# Patient Record
Sex: Female | Born: 1975 | Race: Black or African American | Hispanic: No | Marital: Married | Smoking: Never smoker
Health system: Southern US, Community
[De-identification: ages and names within clinical notes are randomized; demographics above are authoritative.]

## PROBLEM LIST (undated history)

## (undated) DIAGNOSIS — R002 Palpitations: Secondary | ICD-10-CM

## (undated) DIAGNOSIS — R011 Cardiac murmur, unspecified: Secondary | ICD-10-CM

## (undated) DIAGNOSIS — D649 Anemia, unspecified: Secondary | ICD-10-CM

## (undated) HISTORY — DX: Cardiac murmur, unspecified: R01.1

---

## 2008-05-07 ENCOUNTER — Inpatient Hospital Stay: Payer: Self-pay | Admitting: Obstetrics & Gynecology

## 2012-02-15 ENCOUNTER — Observation Stay: Payer: Self-pay

## 2012-02-15 LAB — FETAL FIBRONECTIN
Appearance: NORMAL
Fetal Fibronectin: NEGATIVE

## 2012-04-08 ENCOUNTER — Inpatient Hospital Stay: Payer: Self-pay

## 2012-04-08 LAB — CBC WITH DIFFERENTIAL/PLATELET
Basophil #: 0 10*3/uL (ref 0.0–0.1)
Eosinophil #: 0.1 10*3/uL (ref 0.0–0.7)
HCT: 34 % — ABNORMAL LOW (ref 35.0–47.0)
Lymphocyte #: 1.9 10*3/uL (ref 1.0–3.6)
MCH: 31.9 pg (ref 26.0–34.0)
MCHC: 35.2 g/dL (ref 32.0–36.0)
MCV: 91 fL (ref 80–100)
Monocyte #: 0.7 x10 3/mm (ref 0.2–0.9)
Neutrophil #: 6.5 10*3/uL (ref 1.4–6.5)
Platelet: 137 10*3/uL — ABNORMAL LOW (ref 150–440)
RBC: 3.75 10*6/uL — ABNORMAL LOW (ref 3.80–5.20)
RDW: 16.7 % — ABNORMAL HIGH (ref 11.5–14.5)

## 2012-04-10 LAB — HEMATOCRIT: HCT: 32 % — ABNORMAL LOW (ref 35.0–47.0)

## 2014-09-23 NOTE — H&P (Signed)
L&D Evaluation:  History:   HPI 39 year old G3 P1011 with EDC=04/03/2012 by LMP=06/28/2011 presents at 40 5/7 weeks with c/o strong regular contractions since 1800 tonight. +bloody mucoid show. No LOF. Baby active. Prenatal care at [facility] remarkable for an early ultrasound at [facility] 2 day confirming dates to within 1 week, a normal antomy scan, anemia, and +GBS culture. OB HX remarkable for a ASVD of an 8#4oz baby in 2009. Had her TDAP and flu vaccines this pregnancy. O POS, RI, VI    Presents with contractions    Patient's Medical History anemia    Patient's Surgical History none    Medications Pre Natal Vitamins  Iron    Allergies NKDA    Social History none    Family History Non-Contributory   ROS:   ROS All systems were reviewed.  HEENT, CNS, GI, GU, Respiratory, CV, Renal and Musculoskeletal systems were found to be normal.   Exam:   Vital Signs stable  123/72, afebrile    Urine Protein not completed    General grimacing with ctxs    Mental Status clear    Chest clear    Heart normal sinus rhythm, no murmur/gallop/rubs    Abdomen gravid, tender with contractions    Estimated Fetal Weight Average for gestational age, 72-14    Fetal Position vtx    Edema 1+    Reflexes 1+    Pelvic no external lesions, 3/90%/-1    Mebranes Intact    FHT normal rate with no decels, 130 baseline with accels to 150s to 160s    FHT Description mod variability, CAT 1    Fetal Heart Rate 135    Ucx q4-6 min apart    Skin dry   Impression:   Impression IUP at 40 5/7 weeks in early labor; GBS positive   Plan:   Plan EFM/NST, monitor contractions and for cervical change, antibiotics for GBBS prophylaxis, can ambulate in early labor. Desires epidural when more active.   Electronic Signatures: Karene Fry (CNM)  [phone] 23:47)  Authored: L&D Evaluation   Last Updated: 24-Nov-13 23:47 by Karene Fry (CNM)

## 2014-09-23 NOTE — H&P (Signed)
L&D Evaluation:  History:   HPI 39 year old G3P1011 presents to L&D with c/o ctx's at 33 weeks 1 day. Was seen at office today for c/o ctx's and was noted to be 1 cm. No hx of preterm labor. PNC at [facility] notable for early entry to care and anemia, . EDD 04/03/12    Presents with contractions    Patient's Medical History No Chronic Illness    Patient's Surgical History none    Medications Pre Natal Vitamins  Iron    Allergies NKDA    Social History none    Family History Non-Contributory   ROS:   ROS All systems were reviewed.  HEENT, CNS, GI, GU, Respiratory, CV, Renal and Musculoskeletal systems were found to be normal.   Exam:   Vital Signs stable    Urine Protein not completed    General no apparent distress    Mental Status clear    Abdomen gravid, non-tender    Estimated Fetal Weight Average for gestational age    Edema no edema    Pelvic no external lesions, .5-1/thick/-2    Mebranes Intact    FHT normal rate with no decels    Ucx irregular, mainly irritability   Impression:   Impression 33 weeks, preterm ctx's, no labor   Plan:   Plan EFM/NST, monitor contractions and for cervical change, discharge    Comments cervix is unchanged from previous exam, FFN negative, pt only having occas ctx with some irritability. She works 12 hour night shifts normally, will take out of work for her next 3 shifts in order to allow her to rest. work note given. To follow up in office next week as scheduled.   Electronic Signatures: Shann Medal (CNM)  (Signed 02-Oct-13 14:30)  Authored: L&D Evaluation   Last Updated: 02-Oct-13 14:30 by Shann Medal (CNM)

## 2015-04-01 ENCOUNTER — Ambulatory Visit
Admission: EM | Admit: 2015-04-01 | Discharge: 2015-04-01 | Disposition: A | Discharge status: Home / Self Care | Payer: BC Managed Care – PPO | Attending: Family Medicine | Admitting: Family Medicine

## 2015-04-01 DIAGNOSIS — M25512 Pain in left shoulder: Secondary | ICD-10-CM | POA: Insufficient documentation

## 2015-04-01 DIAGNOSIS — M25561 Pain in right knee: Secondary | ICD-10-CM | POA: Insufficient documentation

## 2015-04-01 DIAGNOSIS — M79609 Pain in unspecified limb: Secondary | ICD-10-CM | POA: Diagnosis not present

## 2015-04-01 DIAGNOSIS — M7542 Impingement syndrome of left shoulder: Secondary | ICD-10-CM | POA: Diagnosis not present

## 2015-04-01 DIAGNOSIS — R002 Palpitations: Secondary | ICD-10-CM | POA: Insufficient documentation

## 2015-04-01 MED ORDER — CELECOXIB 200 MG PO CAPS: 200.0000 mg | ORAL_CAPSULE | Freq: Every day | ORAL | Status: DC

## 2015-04-01 NOTE — ED Provider Notes (Signed)
CSN: [id number]     Arrival date & time 04/01/15  [id number] History   First MD Initiated Contact with Patient 04/01/15 1010     Chief Complaint  Patient presents with  . Knee Pain  . Arm Injury   (Consider location/radiation/quality/duration/timing/severity/associated sxs/prior Treatment) HPI   Sit 39 year old female who presents with 3 separate complaints today.  The first is that of palpitations that will occur spontaneously and intermittently with no associated chest pain but occasional shortness of breath. They last a very short period of time and has no connection to food or activity. An EKG today showed a normal sinus rhythm with no evidence of any ectopy. She states that she does drink a lot of tea and only one cup of coffee usually during her shift at [employer].  Second complaint is that of pain behind her right knee that she notices particularly while driving. Had this for about one week. He denies any injury. She denies any radicular component to the pain and is always very localized to just above the popliteal fossa. Is independent of activity although she states that it is noticed dominantly whenever she is driving. She is not readjusted her seat to see if that will alleviate the pain but she does tend to move her leg around make it less of a bother.  Her third complaint is of numbness that occurred on one occasion lasting for 5 minutes from the elbow into the hand. She states that at nighttime her hand with casing fall asleep and even though she indicates the thumb index and middle fingers she says that all of her TIPS of her fingers seem to go numb. She has pain in her left shoulder. She is right hand dominant. Again she denies any injury to that area.  History reviewed. No pertinent past medical history. History reviewed. No pertinent past surgical history. Family History  Problem Relation Age of Onset  . Hypertension Mother   . Diabetes Father    Social History  Substance Use Topics   . Smoking status: Never Smoker   . Smokeless tobacco: None  . Alcohol Use: Yes     Comment: socially   OB History    No data available     Review of Systems  Constitutional: Positive for activity change. Negative for fever, chills, diaphoresis and fatigue.  Musculoskeletal: Positive for myalgias.  All other systems reviewed and are negative.   Allergies  Review of patient's allergies indicates no known allergies.  Home Medications   Prior to Admission medications   Medication Sig Start Date End Date Taking? Authorizing Provider  calcium carbonate (OS-CAL) 1250 (500 CA) MG chewable tablet Chew 1 tablet by mouth daily.   Yes Historical Provider, MD  glucosamine-chondroitin 500-400 MG tablet Take 1 tablet by mouth 3 (three) times daily.   Yes Historical Provider, MD  celecoxib (CELEBREX) 200 MG capsule Take 1 capsule (200 mg total) by mouth daily. 04/01/15   Lorin Picket, PA-C   Meds Ordered and Administered this Visit  Medications - No data to display  BP 141/92 mmHg  Pulse 70  Temp(Src) 97.4 F (36.3 C) (Tympanic)  Resp 17  Ht 5\' 4"  (1.626 m)  Wt 185 lb (83.915 kg)  BMI 31.74 kg/m2  SpO2 100%  LMP 03/17/2015 (Approximate) No data found.   Physical Exam  Constitutional: She is oriented to person, place, and time. She appears well-developed and well-nourished. No distress.  HENT:  Head: Normocephalic and atraumatic.  Eyes: Pupils are equal,  round, and reactive to light.  Neck: Neck supple.  Cardiovascular: Normal rate, regular rhythm, normal heart sounds and intact distal pulses.  Exam reveals no gallop and no friction rub.   No murmur heard. Pulmonary/Chest: Breath sounds normal. No respiratory distress. She has no wheezes. She has no rales.  Musculoskeletal: Normal range of motion. She exhibits no edema or tenderness.  Examination of the cervical spine shows a normal range of motion flexion extension lateral rotation bilaterally. Upper extremity strength is  intact; sensation is intact to light touch throughout. DTRs are 2+ over 4 and symmetrical. The patient does have some left subacromial tenderness reproducing her symptoms. A negative empty can sign. Arm raise test is negative. Neer test is normal. His admission of her lower extremities specifically on the right shows her to be reluctant to completely straighten the knee for short period of time cause of pain behind her knee which she indicates is more proximal and lateral to the popliteal fossa. Tenderness is intermittent but seems be sharply localized over the same area. She is a negative McMurray sign. Range of motion is full to flexion and extension. There is no collateral ligament laxity. There is a negative anterior drawer sign. Pulses distally are intact in the dorsalis pedis and the popliteal fossa. As well as the posterior tibialis. Station is intact to light touch.  Neurological: She is alert and oriented to person, place, and time.  Skin: Skin is warm and dry. She is not diaphoretic.  Psychiatric: She has a normal mood and affect. Her behavior is normal. Judgment and thought content normal.  Nursing note and vitals reviewed.   ED Course  Procedures (including critical care time)  Labs Review Labs Reviewed - No data to display  Imaging Review No results found.   Visual Acuity Review  Right Eye Distance:   Left Eye Distance:   Bilateral Distance:    Right Eye Near:   Left Eye Near:    Bilateral Near:     ED ECG REPORT   Date: 04/01/2015  EKG Time: 11:12 AM  Rate:64 Rhythm: normal sinus rhythm,  normal EKG, normal sinus rhythm, , there are no previous tracings available for comparison  Axis:normal  Intervals:none  ST&T Change:none  Narrative Interpretation:Normal sinus rhythm.Normal EKG.   I have personally read the EKG and my findings are as noted.            MDM   1. Impingement syndrome of left shoulder   2. Popliteal pain   3. Intermittent palpitations     Discharge Medication List as of 04/01/2015 11:04 AM    START taking these medications   Details  celecoxib (CELEBREX) 200 MG capsule Take 1 capsule (200 mg total) by mouth daily., Starting 04/01/2015, Until Discontinued, Print      Plan: 1. Test/x-ray results and diagnosis reviewed with patient 2. rx as per orders; risks, benefits, potential side effects reviewed with patient 3. Recommend supportive treatment with meds and changes to activities discussed with the patient. 4. F/u prn if symptoms worsen or don't improve  A long discussion with the patient. I've recommended that she consider cutting back or eliminating caffeine in her diet to see if that will help with the palpitations. This does not then I would recommend that she be seen by a cardiologist for possible Holter monitoring. For her left shoulder I stated that she should be careful not to perform activities which seem to cause her to have pain and that she should see  how the anti-inflammatories will help with the discomfort. She may need to be seen by an orthopedist for her knee problem as it was not an injury and I feel no palpable mass in this area. He made the that she would benefit from an MRI but I will leave that to the specialist. I asked her to give the Celebrex a two-week trial to see if this helps both the areas as well as to adjust her seat when driving to see if she can limit the contact to that area behind her knee which may be the cause of her pain. She denies any primary care physician in several years and this may be beneficial to her to follow-up with the primary care for further directions. Her blood pressure is slightly elevated today in addition I recommended she keep an eye on this and then see the primary care physician in this regard.  Lorin Picket, PA-C 04/08/15 [phone]

## 2015-04-01 NOTE — Discharge Instructions (Signed)
Holter Monitoring A Holter monitor is a small device that is used to detect abnormal heart rhythms. It clips to your clothing and is connected by wires to flat, sticky disks (electrodes) that attach to your chest. It is worn continuously for 24-48 hours. HOME CARE INSTRUCTIONS  Wear your Holter monitor at all times, even while exercising and sleeping, for as long as directed by your health care provider.  Make sure that the Holter monitor is safely clipped to your clothing or close to your body as recommended by your health care provider.  Do not get the monitor or wires wet.  Do not put body lotion or moisturizer on your chest.  Keep your skin clean.  Keep a diary of your daily activities, such as walking and doing chores. If you feel that your heartbeat is abnormal or that your heart is fluttering or skipping a beat:  Record what you are doing when it happens.  Record what time of day the symptoms occur.  Return your Holter monitor as directed by your health care provider.  Keep all follow-up visits as directed by your health care provider. This is important. SEEK IMMEDIATE MEDICAL CARE IF:  You feel lightheaded or you faint.  You have trouble breathing.  You feel pain in your chest, upper arm, or jaw.  You feel sick to your stomach and your skin is pale, cool, or damp.  You heartbeat feels unusual or abnormal.   This information is not intended to replace advice given to you by your health care provider. Make sure you discuss any questions you have with your health care provider.   Document Released: 01/29/2004 Document Revised: 05/23/2014 Document Reviewed: 12/09/2013 Elsevier Interactive Patient Education 2016 [employer]  Musculoskeletal Pain Musculoskeletal pain is muscle and boney aches and pains. These pains can occur in any part of the body. Your caregiver may treat you without knowing the cause of the pain. They may treat you if blood or urine tests, X-rays, and  other tests were normal.  CAUSES There is often not a definite cause or reason for these pains. These pains may be caused by a type of germ (virus). The discomfort may also come from overuse. Overuse includes working out too hard when your body is not fit. Boney aches also come from weather changes. Bone is sensitive to atmospheric pressure changes. HOME CARE INSTRUCTIONS   Ask when your test results will be ready. Make sure you get your test results.  Only take over-the-counter or prescription medicines for pain, discomfort, or fever as directed by your caregiver. If you were given medications for your condition, do not drive, operate machinery or power tools, or sign legal documents for 24 hours. Do not drink alcohol. Do not take sleeping pills or other medications that may interfere with treatment.  Continue all activities unless the activities cause more pain. When the pain lessens, slowly resume normal activities. Gradually increase the intensity and duration of the activities or exercise.  During periods of severe pain, bed rest may be helpful. Lay or sit in any position that is comfortable.  Putting ice on the injured area.  Put ice in a bag.  Place a towel between your skin and the bag.  Leave the ice on for 15 to 20 minutes, 3 to 4 times a day.  Follow up with your caregiver for continued problems and no reason can be found for the pain. If the pain becomes worse or does not go away, it may be  necessary to repeat tests or do additional testing. Your caregiver may need to look further for a possible cause. SEEK IMMEDIATE MEDICAL CARE IF:  You have pain that is getting worse and is not relieved by medications.  You develop chest pain that is associated with shortness or breath, sweating, feeling sick to your stomach (nauseous), or throw up (vomit).  Your pain becomes localized to the abdomen.  You develop any new symptoms that seem different or that concern you. MAKE SURE YOU:     Understand these instructions.  Will watch your condition.  Will get help right away if you are not doing well or get worse.   This information is not intended to replace advice given to you by your health care provider. Make sure you discuss any questions you have with your health care provider.   Document Released: 05/02/2005 Document Revised: 07/25/2011 Document Reviewed: 01/04/2013 Elsevier Interactive Patient Education 2016 [place] Cuff Tendinitis Rotator cuff tendinitis is inflammation of the tough, cord-like bands that connect muscle to bone (tendons) in your rotator cuff. Your rotator cuff is the collection of all the muscles and tendons that connect your arm to your shoulder. Your rotator cuff holds the head of your upper arm bone (humerus) in the cup (fossa) of your shoulder blade (scapula). CAUSES Rotator cuff tendinitis is usually caused by overusing the joint involved.  SIGNS AND SYMPTOMS  Deep ache in the shoulder also felt on the outside upper arm over the shoulder muscle.  Point tenderness over the area that is injured.  Pain comes on gradually and becomes worse with lifting the arm to the side (abduction) or turning it inward (internal rotation).  May lead to a chronic tear: When a rotator cuff tendon becomes inflamed, it runs the risk of losing its blood supply, causing some tendon fibers to die. This increases the risk that the tendon can fray and partially or completely tear. DIAGNOSIS Rotator cuff tendinitis is diagnosed by taking a medical history, performing a physical exam, and reviewing results of imaging exams. The medical history is useful to help determine the type of rotator cuff injury. The physical exam will include looking at the injured shoulder, feeling the injured area, and watching you do range-of-motion exercises. X-ray exams are typically done to rule out other causes of shoulder pain, such as fractures. MRI is the imaging exam  usually used for significant shoulder injuries. Sometimes a dye study called CT arthrogram is done, but it is not as widely used as MRI. In some institutions, special ultrasound tests may also be used to aid in the diagnosis. TREATMENT  Less Severe Cases  Use of a sling to rest the shoulder for a short period of time. Prolonged use of the sling can cause stiffness, weakness, and loss of motion of the shoulder joint.  Anti-inflammatory medicines, such as ibuprofen or naproxen sodium, may be prescribed. More Severe Cases  Physical therapy.  Use of steroid injections into the shoulder joint.  Surgery. HOME CARE INSTRUCTIONS   Use a sling or splint until the pain decreases. Prolonged use of the sling can cause stiffness, weakness, and loss of motion of the shoulder joint.  Apply ice to the injured area:  Put ice in a plastic bag.  Place a towel between your skin and the bag.  Leave the ice on for 20 minutes, 2-3 times a day.  Try to avoid use other than gentle range of motion while your shoulder is painful. Use the shoulder  and exercise only as directed by your health care provider. Stop exercises or range of motion if pain or discomfort increases, unless directed otherwise by your health care provider.  Only take over-the-counter or prescription medicines for pain, discomfort, or fever as directed by your health care provider.  If you were given a shoulder sling and straps (immobilizer), do not remove it except as directed, or until you see a health care provider for a follow-up exam. If you need to remove it, move your arm as little as possible or as directed.  You may want to sleep on several pillows at night to lessen swelling and pain. SEEK IMMEDIATE MEDICAL CARE IF:   Your shoulder pain increases or new pain develops in your arm, hand, or fingers and is not relieved with medicines.  You have new, unexplained symptoms, especially increased numbness in the hands or loss of  strength.  You develop any worsening of the problems that brought you in for care.  Your arm, hand, or fingers are numb or tingling.  Your arm, hand, or fingers are swollen, painful, or turn white or blue. MAKE SURE YOU:  Understand these instructions.  Will watch your condition.  Will get help right away if you are not doing well or get worse.   This information is not intended to replace advice given to you by your health care provider. Make sure you discuss any questions you have with your health care provider.   Document Released: 07/23/2003 Document Revised: 05/23/2014 Document Reviewed: 12/12/2012 Elsevier Interactive Patient Education [employer].

## 2015-04-01 NOTE — ED Notes (Signed)
C/o pain behind right knee x 4-5 days. Worsening pain when driving her car. Also numbness between left elbow and wrist lasting 5 minutes, last Friday. Also feels occasional palpitations for several months. Last saw PMD several years ago

## 2015-05-17 DIAGNOSIS — R002 Palpitations: Secondary | ICD-10-CM

## 2015-05-17 HISTORY — DX: Palpitations: R00.2

## 2015-06-18 LAB — HM PAP SMEAR: HM Pap smear: NORMAL

## 2015-06-23 ENCOUNTER — Other Ambulatory Visit: Payer: Self-pay | Admitting: Obstetrics and Gynecology

## 2015-06-23 ENCOUNTER — Other Ambulatory Visit: Payer: Self-pay | Admitting: *Deleted

## 2015-06-23 ENCOUNTER — Inpatient Hospital Stay
Admission: RE | Admit: 2015-06-23 | Discharge: 2015-06-23 | Disposition: A | Discharge status: Home / Self Care | Payer: Self-pay | Source: Ambulatory Visit | Attending: *Deleted | Admitting: *Deleted

## 2015-06-23 DIAGNOSIS — Z9289 Personal history of other medical treatment: Secondary | ICD-10-CM

## 2015-06-23 DIAGNOSIS — R928 Other abnormal and inconclusive findings on diagnostic imaging of breast: Secondary | ICD-10-CM

## 2015-07-07 ENCOUNTER — Other Ambulatory Visit: Payer: Self-pay | Admitting: Obstetrics and Gynecology

## 2015-07-07 ENCOUNTER — Ambulatory Visit
Admission: RE | Admit: 2015-07-07 | Discharge: 2015-07-07 | Disposition: A | Discharge status: Home / Self Care | Payer: BC Managed Care – PPO | Source: Ambulatory Visit | Attending: Obstetrics and Gynecology | Admitting: Obstetrics and Gynecology

## 2015-07-07 DIAGNOSIS — R928 Other abnormal and inconclusive findings on diagnostic imaging of breast: Secondary | ICD-10-CM

## 2015-07-07 DIAGNOSIS — N63 Unspecified lump in breast: Secondary | ICD-10-CM | POA: Insufficient documentation

## 2015-10-26 ENCOUNTER — Encounter: Payer: Self-pay | Admitting: Emergency Medicine

## 2015-10-26 ENCOUNTER — Ambulatory Visit
Admission: EM | Admit: 2015-10-26 | Discharge: 2015-10-26 | Disposition: A | Discharge status: Home / Self Care | Payer: BC Managed Care – PPO | Attending: Family Medicine | Admitting: Family Medicine

## 2015-10-26 DIAGNOSIS — R002 Palpitations: Secondary | ICD-10-CM | POA: Diagnosis not present

## 2015-10-26 NOTE — Discharge Instructions (Signed)
Holter Monitoring  A Holter monitor is a small device that is used to detect abnormal heart rhythms. It clips to your clothing and is connected by wires to flat, sticky disks (electrodes) that attach to your chest. It is worn continuously for 24-48 hours.  HOME CARE INSTRUCTIONS   Wear your Holter monitor at all times, even while exercising and sleeping, for as long as directed by your health care provider.   Make sure that the Holter monitor is safely clipped to your clothing or close to your body as recommended by your health care provider.   Do not get the monitor or wires wet.   Do not put body lotion or moisturizer on your chest.   Keep your skin clean.   Keep a diary of your daily activities, such as walking and doing chores. If you feel that your heartbeat is abnormal or that your heart is fluttering or skipping a beat:    Record what you are doing when it happens.    Record what time of day the symptoms occur.   Return your Holter monitor as directed by your health care provider.   Keep all follow-up visits as directed by your health care provider. This is important.  SEEK IMMEDIATE MEDICAL CARE IF:   You feel lightheaded or you faint.   You have trouble breathing.   You feel pain in your chest, upper arm, or jaw.   You feel sick to your stomach and your skin is pale, cool, or damp.   You heartbeat feels unusual or abnormal.     This information is not intended to replace advice given to you by your health care provider. Make sure you discuss any questions you have with your health care provider.     Document Released: 01/29/2004 Document Revised: 05/23/2014 Document Reviewed: 12/09/2013  Elsevier Interactive Patient Education 2016 Elsevier Inc.  Palpitations  A palpitation is the feeling that your heartbeat is irregular or is faster than normal. It may feel like your heart is fluttering or skipping a beat. Palpitations are usually not a serious problem. However, in some cases, you may need  further medical evaluation.  CAUSES   Palpitations can be caused by:   Smoking.   Caffeine or other stimulants, such as diet pills or energy drinks.   Alcohol.   Stress and anxiety.   Strenuous physical activity.   Fatigue.   Certain medicines.   Heart disease, especially if you have a history of irregular heart rhythms (arrhythmias), such as atrial fibrillation, atrial flutter, or supraventricular tachycardia.   An improperly working pacemaker or defibrillator.  DIAGNOSIS   To find the cause of your palpitations, your health care provider will take your medical history and perform a physical exam. Your health care provider may also have you take a test called an ambulatory electrocardiogram (ECG). An ECG records your heartbeat patterns over a 24-hour period. You may also have other tests, such as:   Transthoracic echocardiogram (TTE). During echocardiography, sound waves are used to evaluate how blood flows through your heart.   Transesophageal echocardiogram (TEE).   Cardiac monitoring. This allows your health care provider to monitor your heart rate and rhythm in real time.   Holter monitor. This is a portable device that records your heartbeat and can help diagnose heart arrhythmias. It allows your health care provider to track your heart activity for several days, if needed.   Stress tests by exercise or by giving medicine that makes the heart beat faster.    TREATMENT   Treatment of palpitations depends on the cause of your symptoms and can vary greatly. Most cases of palpitations do not require any treatment other than time, relaxation, and monitoring your symptoms. Other causes, such as atrial fibrillation, atrial flutter, or supraventricular tachycardia, usually require further treatment.  HOME CARE INSTRUCTIONS    Avoid:    Caffeinated coffee, tea, soft drinks, diet pills, and energy drinks.    Chocolate.    Alcohol.   Stop smoking if you smoke.   Reduce your stress and anxiety. Things that  can help you relax include:    A method of controlling things in your body, such as your heartbeats, with your mind (biofeedback).    Yoga.    Meditation.    Physical activity such as swimming, jogging, or walking.   Get plenty of rest and sleep.  SEEK MEDICAL CARE IF:    You continue to have a fast or irregular heartbeat beyond 24 hours.   Your palpitations occur more often.  SEEK IMMEDIATE MEDICAL CARE IF:   You have chest pain or shortness of breath.   You have a severe headache.   You feel dizzy or you faint.  MAKE SURE YOU:   Understand these instructions.   Will watch your condition.   Will get help right away if you are not doing well or get worse.     This information is not intended to replace advice given to you by your health care provider. Make sure you discuss any questions you have with your health care provider.     Document Released: 04/29/2000 Document Revised: 05/07/2013 Document Reviewed: 07/01/2011  Elsevier Interactive Patient Education 2016 Elsevier Inc.

## 2015-10-26 NOTE — ED Provider Notes (Signed)
CSN: [id number]     Arrival date & time 10/26/15  [id number] History   First MD Initiated Contact with Patient 10/26/15 1010     Chief Complaint  Patient presents with  . 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 She was lying in bed reading the newspaper reports on her phone when she started having an arrhythmia with a feeling of something burning in her chest and then having a very rapid rhythm. Started having shortness of breath and some chest pain and back pain which has since subsided. She was seen by her primary care physician with similar complaints and she has been scheduled for Holter monitor through [facility] heart on Wednesday next week.     History reviewed. No pertinent past medical history. History reviewed. No pertinent past surgical history. Family History  Problem Relation Age of Onset  . Hypertension Mother   . Diabetes Father   . Breast cancer Neg Hx    Social History  Substance Use Topics  . Smoking status: Never Smoker   . Smokeless tobacco: None  . Alcohol Use: Yes     Comment: socially   OB History    No data available     Review of Systems  Constitutional: Negative for fever, chills, activity change and fatigue.  Respiratory: Positive for shortness of breath.   Cardiovascular: Positive for chest pain and palpitations. Negative for leg swelling.  All other systems reviewed and are negative.   Allergies  Review of patient's allergies indicates no known allergies.  Home Medications   Prior to Admission medications   Medication Sig Start Date End Date Taking? Authorizing Provider  calcium carbonate (OS-CAL) 1250 (500 CA) MG chewable tablet Chew 1 tablet by mouth daily.    Historical Provider, MD  celecoxib (CELEBREX) 200 MG capsule Take 1 capsule  (200 mg total) by mouth daily. 04/01/15   Lorin Picket, PA-C  glucosamine-chondroitin 500-400 MG tablet Take 1 tablet by mouth 3 (three) times daily.    Historical Provider, MD   Meds Ordered and Administered this Visit  Medications - No data to display  BP 140/65 mmHg  Pulse 71  Temp(Src) 98.2 F (36.8 C) (Oral)  Resp 16  Ht 5\' 4"  (1.626 m)  Wt 195 lb (88.451 kg)  BMI 33.46 kg/m2  SpO2 100%  LMP 10/05/2015 (Approximate) No data found.   Physical Exam  Constitutional: She is oriented to person, place, and time. She appears well-developed and well-nourished. No distress.  HENT:  Head: Normocephalic and atraumatic.  Eyes: Conjunctivae are normal. Pupils are equal, round, and reactive to light.  Neck: Normal range of motion. Neck supple.  Cardiovascular: Normal rate and normal heart sounds.  Exam reveals no gallop and no friction rub.   No murmur heard. Examination of the heart shows a normal rate with an audible arrhythmia. There is a normal S1-S2 there is no murmur or gallop appreciated.  Pulmonary/Chest: Effort normal and breath sounds normal. No respiratory distress. She has no wheezes. She has no rales.  Musculoskeletal: Normal range of motion.  Neurological: She is alert and oriented to person, place, and time.  Skin: Skin is warm and dry. She is not diaphoretic.  Psychiatric: She has a normal mood and affect. Her behavior is normal. Judgment and thought content normal.  Nursing note and vitals reviewed.   ED Course  Procedures (including  critical care time)  Labs Review Labs Reviewed - No data to display  Imaging Review No results found.   Visual Acuity Review  Right Eye Distance:   Left Eye Distance:   Bilateral Distance:    Right Eye Near:   Left Eye Near:    Bilateral Near:      EKG performed today shows a sinus rhythm with a marked sinus arrhythmia. Ventricular rate of 67 bpm., PR interval 162 ms, QRS duration 84 ms,QTC of 412 ms, axis is normal.  This tracing compared with a tracing from 04/01/2015 shows the sinus rhythm with a marked sinus arrhythmia and inverted QRS complexes in V3 today demonstrating poor R wave progression. Otherwise no significant change seen.     MDM   1. Intermittent palpitations    Discharge Medication List as of 10/26/2015 10:40 AM    Plan: 1. Test/x-ray results and diagnosis reviewed with patient 2. rx as per orders; risks, benefits, potential side effects reviewed with patient 3. Recommend supportive treatment with Abstinence from caffeine until reevaluated by the cardiologist. She does drink 4 glasses of wine per week may also be a trigger for the arrhythmia. If she has increasing rapid rhythms that she is not able to break with Valsalva maneuver where she has chest pain that last bone and 5 minutes she should be seen in the emergency department by calling 911. I've also recommended that iinstead of just having a Holter monitor applied she should be evaluated by the cardiologist as well. Medications were not provided to the patient at this visit. 4. F/u prn if symptoms worsen or don't improve      Lorin Picket, PA-C 10/26/15 1117

## 2015-10-26 NOTE — ED Notes (Signed)
Patient c/o palpitations and increase heart rate that started early this morning.  Patient reports SOB.   Patient saw PCP a month ago and has scheduled her to wear a holter monitor.

## 2016-05-16 HISTORY — PX: BREAST EXCISIONAL BIOPSY: SUR124

## 2016-06-24 ENCOUNTER — Other Ambulatory Visit: Payer: Self-pay | Admitting: Obstetrics and Gynecology

## 2016-06-24 DIAGNOSIS — N6313 Unspecified lump in the right breast, lower outer quadrant: Secondary | ICD-10-CM

## 2016-06-24 DIAGNOSIS — N6310 Unspecified lump in the right breast, unspecified quadrant: Secondary | ICD-10-CM

## 2016-06-24 DIAGNOSIS — N6311 Unspecified lump in the right breast, upper outer quadrant: Secondary | ICD-10-CM

## 2016-07-15 ENCOUNTER — Ambulatory Visit
Admission: RE | Admit: 2016-07-15 | Discharge: 2016-07-15 | Disposition: A | Discharge status: Home / Self Care | Payer: BC Managed Care – PPO | Source: Ambulatory Visit | Attending: Obstetrics and Gynecology | Admitting: Obstetrics and Gynecology

## 2016-07-15 ENCOUNTER — Other Ambulatory Visit: Payer: Self-pay | Admitting: Obstetrics and Gynecology

## 2016-07-15 DIAGNOSIS — N6311 Unspecified lump in the right breast, upper outer quadrant: Secondary | ICD-10-CM | POA: Diagnosis not present

## 2016-07-15 DIAGNOSIS — N6313 Unspecified lump in the right breast, lower outer quadrant: Secondary | ICD-10-CM

## 2016-07-15 DIAGNOSIS — N6310 Unspecified lump in the right breast, unspecified quadrant: Secondary | ICD-10-CM

## 2016-07-15 DIAGNOSIS — R928 Other abnormal and inconclusive findings on diagnostic imaging of breast: Secondary | ICD-10-CM

## 2016-08-04 ENCOUNTER — Ambulatory Visit
Admission: RE | Admit: 2016-08-04 | Discharge: 2016-08-04 | Disposition: A | Discharge status: Home / Self Care | Payer: BC Managed Care – PPO | Source: Ambulatory Visit | Attending: Obstetrics and Gynecology | Admitting: Obstetrics and Gynecology

## 2016-08-04 DIAGNOSIS — N6310 Unspecified lump in the right breast, unspecified quadrant: Secondary | ICD-10-CM

## 2016-08-04 DIAGNOSIS — R928 Other abnormal and inconclusive findings on diagnostic imaging of breast: Secondary | ICD-10-CM | POA: Diagnosis present

## 2016-08-04 DIAGNOSIS — N6314 Unspecified lump in the right breast, lower inner quadrant: Secondary | ICD-10-CM | POA: Insufficient documentation

## 2016-08-04 HISTORY — PX: BREAST BIOPSY: SHX20

## 2016-08-05 ENCOUNTER — Telehealth: Payer: Self-pay

## 2016-08-05 LAB — SURGICAL PATHOLOGY

## 2016-08-05 NOTE — Telephone Encounter (Signed)
Arville Go states that they received a form saying that he wants to call pt regarding results. Arville Go is faxing over results and I will give them to SDJ to review.

## 2016-08-05 NOTE — Telephone Encounter (Signed)
Joann from Dr. Brigitte Pulse, radiologist c [facility] called re bx results and rec from yesterday's procedure. Please call.

## 2016-08-10 ENCOUNTER — Telehealth: Payer: Self-pay | Admitting: Obstetrics and Gynecology

## 2016-08-10 DIAGNOSIS — N6315 Unspecified lump in the right breast, overlapping quadrants: Secondary | ICD-10-CM

## 2016-08-10 DIAGNOSIS — N6310 Unspecified lump in the right breast, unspecified quadrant: Principal | ICD-10-CM

## 2016-08-10 NOTE — Telephone Encounter (Signed)
Called patient to discuss having breast mass removed.  No answer. Left VM. Please let me know when she call back.

## 2016-08-10 NOTE — Telephone Encounter (Signed)
Pt returned your call, lm on my ext to let you know you can call her back

## 2016-08-10 NOTE — Telephone Encounter (Signed)
Left vm to call me again.

## 2016-08-11 NOTE — Telephone Encounter (Signed)
Pt returning call from yesterday.  Will keep phone beside her.  [phone]

## 2016-08-16 NOTE — Telephone Encounter (Signed)
Left vm to call me

## 2016-08-17 NOTE — Telephone Encounter (Signed)
Please call pt . Pt Would like an return call

## 2016-08-17 NOTE — Telephone Encounter (Signed)
Left vm to call me

## 2016-08-18 ENCOUNTER — Encounter: Payer: Self-pay | Admitting: General Surgery

## 2016-08-18 DIAGNOSIS — N6310 Unspecified lump in the right breast, unspecified quadrant: Secondary | ICD-10-CM | POA: Insufficient documentation

## 2016-08-18 NOTE — Addendum Note (Signed)
Addended by: Prentice Docker D on: 08/18/2016 01:29 PM   Modules accepted: Orders

## 2016-08-18 NOTE — Telephone Encounter (Signed)
Patient is scheduled for Dr Dwyane Luo first available appointment on Tues ,08/30/16 @ 2:00pm. Threasa Beards will mail paperwork. Patient to arrive @ 1:45pm w/ completed paperwork, 1:30pm if not, and bring ins/id/specialty copay/meds in original bottles. Lmtrc.

## 2016-08-18 NOTE — Telephone Encounter (Addendum)
Discussed path findings with patient.  Plan is refer to general surgery for complete mass removal and path review to be sure mass is benign.  She understands that I will refer her to Dr. Bary Castilla.

## 2016-08-22 NOTE — Telephone Encounter (Signed)
Pt aware.

## 2016-08-24 ENCOUNTER — Encounter: Payer: Self-pay | Admitting: *Deleted

## 2016-08-30 ENCOUNTER — Encounter: Payer: Self-pay | Admitting: General Surgery

## 2016-08-30 ENCOUNTER — Inpatient Hospital Stay: Payer: Self-pay

## 2016-08-30 ENCOUNTER — Ambulatory Visit (INDEPENDENT_AMBULATORY_CARE_PROVIDER_SITE_OTHER): Payer: BC Managed Care – PPO | Admitting: General Surgery

## 2016-08-30 VITALS — BP 106/62 | HR 62 | Resp 12 | Ht 64.0 in | Wt 200.0 lb

## 2016-08-30 DIAGNOSIS — N6310 Unspecified lump in the right breast, unspecified quadrant: Secondary | ICD-10-CM | POA: Diagnosis not present

## 2016-08-30 NOTE — Progress Notes (Signed)
Patient ID: Karina Martin, female   DOB: [birth year], 41 y.o.   MRN: [id number]  Chief Complaint  Patient presents with  . Mass    HPI Karina Martin is a 41 y.o. female.  who presents for a breast evaluation referred by Dr Prentice Docker. The most recent mammogram with breast biopsy was done on 08-04-16.  Patient does perform regular self breast checks and gets regular mammograms done.  She could not feel anything different in the breast. No breast injury or trauma.  Family history of breast cancer is distance cousins. 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  HPI  Past Medical History:  Diagnosis Date  . Murmur     Past Surgical History:  Procedure Laterality Date  . 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 Hypertension Mother   . Diabetes Father   . Breast cancer Cousin     second cousin on fathers side  . Colon cancer Neg Hx     Social History Social History  Substance Use Topics  . Smoking status: Never Smoker  . Smokeless tobacco: Never Used  . Alcohol use Yes     Comment: wine socially    No Known Allergies  No current outpatient prescriptions on file.   No current facility-administered medications for this visit.     Review of Systems Review of Systems  Constitutional: Negative.   Respiratory: Negative.   Cardiovascular: Negative.   Neurological: Positive for headaches.    Blood pressure 106/62, pulse 62, resp. rate 12, height 5\' 4"  (1.626 m), weight 200 lb (90.7 kg), last menstrual period 08/21/2016.  Physical Exam Physical Exam  Constitutional: She is oriented to person, place, and time. She appears well-developed and well-nourished.  HENT:  Mouth/Throat: Oropharynx is clear and moist.  Eyes: Conjunctivae are normal. No scleral icterus.  Neck: Neck supple.  Cardiovascular: Normal rate and regular rhythm.   Murmur heard.  Systolic murmur is present with a grade of 2/6  Pulmonary/Chest: Effort normal and breath sounds normal. Right breast exhibits no inverted nipple, no mass, no nipple discharge, no skin change and no tenderness. Left breast exhibits no inverted nipple, no mass, no nipple discharge, no skin change and no tenderness.    Lymphadenopathy:    She has no cervical adenopathy.    She has no axillary adenopathy.  Neurological: She is alert and oriented to person, place, and time.  Skin: Skin is warm and dry.  Psychiatric: Her behavior is normal.    Data Reviewed March 22nd, 2018 core biopsy of the right breast, 6:00 lesion: DIAGNOSIS:  A. 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 IMPRESSION: 1. This circumscribed oval solid mass in the 6 o'clock region of the right breast has increased in size since February 2017. Although imaging features remain compatible with fibroadenoma, malignancy cannot be excluded and ultrasound-guided biopsy is suggested. 2. 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 3. No evidence of malignancy in the left breast.   Ultrasound examination of the breast was completed to determine if imaging localization would be required postbiopsy.  In the right breast at the 9:00 position, 7 cm from the nipple a ellipsoid mass measuring 0.4 x 0.9 x 1.02 cm is identified. This is essentially anechoic with very little  if any posterior acoustic enhancement. Minimal distortion of adjacent breast architecture. BI-RADS-3.  The lesion at the 6:00 position, 3 cm from nipple is deeply based about 3 cm in depth. This is is softly lobulated and irregular measuring 1.2 x 1.3 x 1.8 cm. The biopsy clip is evident within the lesion. BI-RADS-3.  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   Plan    At the time of her initial imaging in February 2017 the patient was encouraged to have a 6 month follow-up to confirm stability of the lesions. Based on record review this was deferred to 12 month follow-up.  The core samples are likely accurate, and this is barely a cellular fibroadenoma rather than a phyllodes tumor.  The operative to have the second, stable lesion removed to the time of her planned surgical procedure was reviewed. Indications for this would remove need for special follow-up imaging of this lesion. Risks of additional surgery are minimal, as she will already the under general anesthesia for removal of the deeper lesion at the 6:00 position.  The possibility separate incision was reviewed.  This is a desires for the patient and we'll plan to proceed with excision of both lesions in the near future.     Recommend excision of right breast mass with Same Day Surgery  HPI, Physical Exam, Assessment and Plan have been scribed under the direction and in the presence of Karina Bellow, MD.  Karina Fetch, RN  The patient is scheduled for surgery at [facility] on 09/06/16. She will pre admit by phone. The patient is aware of date and instructions.  Documented by Lesly Rubenstein LPN  I have completed the exam and reviewed the above documentation for accuracy and completeness.  I agree with the above.  Haematologist has been used and any errors in dictation or transcription are unintentional.  Hervey Ard, M.D., F.A.C.S.  Karina Martin 08/31/2016, 6:48 AM

## 2016-08-30 NOTE — Patient Instructions (Addendum)
The patient is aware to call back for any questions or concerns. excision of right breast mass with Same Day Surgery  The patient is scheduled for surgery at [facility] on 09/06/16. She will pre admit by phone. The patient is aware of date and instructions.

## 2016-09-01 ENCOUNTER — Encounter
Admission: RE | Admit: 2016-09-01 | Discharge: 2016-09-01 | Disposition: A | Discharge status: Home / Self Care | Payer: BC Managed Care – PPO | Source: Ambulatory Visit | Attending: General Surgery | Admitting: General Surgery

## 2016-09-01 HISTORY — DX: Palpitations: R00.2

## 2016-09-01 HISTORY — DX: Anemia, unspecified: D64.9

## 2016-09-01 NOTE — Pre-Procedure Instructions (Signed)
CARD HOLTER MONITOR 48 HOURS - SCAN AND ANALYSIS8/01/2016 [facility] Result Narrative  Recording Start Date/Time: 12/16/2015 2:22:04 PM Min HR: 45 BPM At: 12/17/2015 2:06:26 PM Max HR: 152 BPM At: 12/18/2015 1:28:29 PM Avg HR: 75 BPM Pauses > 2032ms:0 Longest RR: 1.556 at 12/17/2015 2:40:06 PM  Ventricular Beats: 122 Couplets: 0 Runs: 0 Fastest Run:BPM at  Longest Ventricular Run:BPM at  Supraventricular Prematurity: 25 % Supraventricular Beats: 2 Supraventricular Fastest Run:BPM at   Referring Physician: Durward Parcel ([id number]) Processing Location: [facility] Procedure Type: HOLTER SCAN & ANALYSIS Date Processed: 12/23/2015 Recording Duration: 47 hr, 56 min Hookup Technician: Cherre Huger Analyst: Sherene Sires Recorder Number: [id number]  Diagnosis: Palpitations [R00.2]  Conclusions: The predominant rhythm was sinus with an average heart rate of 75 bpm.Atrial and ventricular ectopy were rare.There were no pauses of greater than 2 seconds and there were no episodes of AV block.On several occasions, the patient  complained of palpitation, and on each occasion the rhythm was sinus arrhythmia with no ectopy.Monitor quality was fair. Reviewing Physician:  Signed By: Renne Musca, M.D. Date: 12/28/2015  Status Results Details    Appointment on 12/16/2015 [place]")' href="[email or web address]/">Encounter Summary

## 2016-09-01 NOTE — Patient Instructions (Signed)
  Your procedure is scheduled on: 09-06-16 Report to Same Day Surgery 2nd floor medical mall [facility] Entrance-take elevator on left to 2nd floor.  Check in with surgery information desk.) To find out your arrival time please call [phone] between 1PM - 3PM on 09-05-16  Remember: Instructions that are not followed completely may result in serious medical risk, up to and including death, or upon the discretion of your surgeon and anesthesiologist your surgery may need to be rescheduled.    _x___ 1. Do not eat food or drink liquids after midnight. No gum chewing or                              hard candies.     __x__ 2. No Alcohol for 24 hours before or after surgery.   __x__3. No Smoking for 24 prior to surgery.   ____  4. Bring all medications with you on the day of surgery if instructed.    __x__ 5. Notify your doctor if there is any change in your medical condition     (cold, fever, infections).     Do not wear jewelry, make-up, hairpins, clips or nail polish.  Do not wear lotions, powders, or perfumes. You may wear deodorant.  Do not shave 48 hours prior to surgery. Men may shave face and neck.  Do not bring valuables to the hospital.    [facility] is not responsible for any belongings or valuables.               Contacts, dentures or bridgework may not be worn into surgery.  Leave your suitcase in the car. After surgery it may be brought to your room.  For patients admitted to the hospital, discharge time is determined by your                       treatment team.   Patients discharged the day of surgery will not be allowed to drive home.  You will need someone to drive you home and stay with you the night of your procedure.    Please read over the following fact sheets that you were given:   [facility] Preparing for Surgery and or MRSA Information   ____ Take anti-hypertensive (unless it includes a diuretic), cardiac, seizure, asthma,     anti-reflux and psychiatric  medicines. These include:  1. NONE  2.  3.  4.  5.  6.  ____Fleets enema or Magnesium Citrate as directed.   ____ Use CHG Soap or sage wipes as directed on instruction sheet   ____ Use inhalers on the day of surgery and bring to hospital day of surgery  ____ Stop Metformin and Janumet 2 days prior to surgery.    ____ Take 1/2 of usual insulin dose the night before surgery and none on the morning     surgery.   ____ Follow recommendations from Cardiologist, Pulmonologist or PCP regarding  stopping Aspirin, Coumadin, Pllavix ,Eliquis, Effient, or Pradaxa, and Pletal.  ____Stop Anti-inflammatories such as Advil, Aleve, Ibuprofen, Motrin, Naproxen, Naprosyn, Goodies powders or aspirin products -OK to take Tylenol    ____ Stop supplements until after surgery.     ____ Bring C-Pap to the hospital.

## 2016-09-06 ENCOUNTER — Ambulatory Visit: Payer: BC Managed Care – PPO | Admitting: Certified Registered Nurse Anesthetist

## 2016-09-06 ENCOUNTER — Encounter
Admission: RE | Disposition: A | Discharge status: Home / Self Care | Payer: Self-pay | Source: Ambulatory Visit | Attending: General Surgery

## 2016-09-06 ENCOUNTER — Encounter: Payer: Self-pay | Admitting: *Deleted

## 2016-09-06 ENCOUNTER — Ambulatory Visit
Admission: RE | Admit: 2016-09-06 | Discharge: 2016-09-06 | Disposition: A | Discharge status: Home / Self Care | Payer: BC Managed Care – PPO | Source: Ambulatory Visit | Attending: General Surgery | Admitting: General Surgery

## 2016-09-06 DIAGNOSIS — N6310 Unspecified lump in the right breast, unspecified quadrant: Secondary | ICD-10-CM | POA: Diagnosis present

## 2016-09-06 DIAGNOSIS — D241 Benign neoplasm of right breast: Secondary | ICD-10-CM | POA: Insufficient documentation

## 2016-09-06 DIAGNOSIS — N6313 Unspecified lump in the right breast, lower outer quadrant: Secondary | ICD-10-CM | POA: Diagnosis not present

## 2016-09-06 HISTORY — PX: BREAST LUMPECTOMY: SHX2

## 2016-09-06 LAB — POCT PREGNANCY, URINE: Preg Test, Ur: NEGATIVE

## 2016-09-06 SURGERY — BREAST LUMPECTOMY
Anesthesia: General | Laterality: Right | Wound class: Clean

## 2016-09-06 MED ORDER — MIDAZOLAM HCL 2 MG/2ML IJ SOLN
INTRAMUSCULAR | Status: DC | PRN
  Administered 2016-09-06: 2 mg via INTRAVENOUS

## 2016-09-06 MED ORDER — ACETAMINOPHEN 10 MG/ML IV SOLN
INTRAVENOUS | Status: AC
  Filled 2016-09-06: qty 100

## 2016-09-06 MED ORDER — KETOROLAC TROMETHAMINE 30 MG/ML IJ SOLN
INTRAMUSCULAR | Status: DC | PRN
  Administered 2016-09-06: 30 mg via INTRAVENOUS

## 2016-09-06 MED ORDER — LIDOCAINE HCL (PF) 2 % IJ SOLN
INTRAMUSCULAR | Status: AC
  Filled 2016-09-06: qty 2

## 2016-09-06 MED ORDER — FAMOTIDINE 20 MG PO TABS
20.0000 mg | ORAL_TABLET | Freq: Once | ORAL | Status: AC
  Administered 2016-09-06: 20 mg via ORAL

## 2016-09-06 MED ORDER — PROPOFOL 10 MG/ML IV BOLUS
INTRAVENOUS | Status: AC
  Filled 2016-09-06: qty 20

## 2016-09-06 MED ORDER — KETOROLAC TROMETHAMINE 30 MG/ML IJ SOLN
INTRAMUSCULAR | Status: AC
  Filled 2016-09-06: qty 1

## 2016-09-06 MED ORDER — ONDANSETRON HCL 4 MG/2ML IJ SOLN
INTRAMUSCULAR | Status: AC
  Filled 2016-09-06: qty 2

## 2016-09-06 MED ORDER — FAMOTIDINE 20 MG PO TABS
ORAL_TABLET | ORAL | Status: AC
  Administered 2016-09-06: 20 mg via ORAL
  Filled 2016-09-06: qty 1

## 2016-09-06 MED ORDER — PHENYLEPHRINE HCL 10 MG/ML IJ SOLN
INTRAMUSCULAR | Status: DC | PRN
  Administered 2016-09-06: 100 ug via INTRAVENOUS

## 2016-09-06 MED ORDER — BUPIVACAINE-EPINEPHRINE (PF) 0.5% -1:200000 IJ SOLN
INTRAMUSCULAR | Status: AC
  Filled 2016-09-06: qty 30

## 2016-09-06 MED ORDER — HYDROCODONE-ACETAMINOPHEN 5-325 MG PO TABS: 1.0000 | ORAL_TABLET | ORAL | 0 refills | Status: DC | PRN

## 2016-09-06 MED ORDER — LACTATED RINGERS IV SOLN
INTRAVENOUS | Status: DC
  Administered 2016-09-06: 11:00:00 via INTRAVENOUS

## 2016-09-06 MED ORDER — BUPIVACAINE-EPINEPHRINE (PF) 0.5% -1:200000 IJ SOLN
INTRAMUSCULAR | Status: DC | PRN
  Administered 2016-09-06: 13 mL

## 2016-09-06 MED ORDER — LIDOCAINE HCL (CARDIAC) 20 MG/ML IV SOLN
INTRAVENOUS | Status: DC | PRN
  Administered 2016-09-06: 80 mg via INTRAVENOUS

## 2016-09-06 MED ORDER — ACETAMINOPHEN 10 MG/ML IV SOLN
INTRAVENOUS | Status: DC | PRN
  Administered 2016-09-06: 1000 mg via INTRAVENOUS

## 2016-09-06 MED ORDER — MIDAZOLAM HCL 2 MG/2ML IJ SOLN
INTRAMUSCULAR | Status: AC
  Filled 2016-09-06: qty 2

## 2016-09-06 MED ORDER — ONDANSETRON HCL 4 MG/2ML IJ SOLN: 4.0000 mg | Freq: Once | INTRAMUSCULAR | Status: DC | PRN

## 2016-09-06 MED ORDER — DEXAMETHASONE SODIUM PHOSPHATE 10 MG/ML IJ SOLN
INTRAMUSCULAR | Status: DC | PRN
  Administered 2016-09-06: 10 mg via INTRAVENOUS

## 2016-09-06 MED ORDER — DEXAMETHASONE SODIUM PHOSPHATE 10 MG/ML IJ SOLN
INTRAMUSCULAR | Status: AC
  Filled 2016-09-06: qty 1

## 2016-09-06 MED ORDER — FENTANYL CITRATE (PF) 100 MCG/2ML IJ SOLN
INTRAMUSCULAR | Status: AC
  Filled 2016-09-06: qty 2

## 2016-09-06 MED ORDER — FENTANYL CITRATE (PF) 100 MCG/2ML IJ SOLN: 25.0000 ug | INTRAMUSCULAR | Status: DC | PRN

## 2016-09-06 MED ORDER — FENTANYL CITRATE (PF) 100 MCG/2ML IJ SOLN
INTRAMUSCULAR | Status: DC | PRN
  Administered 2016-09-06 (×2): 50 ug via INTRAVENOUS

## 2016-09-06 MED ORDER — PROPOFOL 10 MG/ML IV BOLUS
INTRAVENOUS | Status: DC | PRN
  Administered 2016-09-06: 200 mg via INTRAVENOUS

## 2016-09-06 SURGICAL SUPPLY — 50 items
BANDAGE ELASTIC 6 LF NS (GAUZE/BANDAGES/DRESSINGS) IMPLANT
BLADE SURG 15 STRL SS SAFETY (BLADE) ×6 IMPLANT
BNDG GAUZE 4.5X4.1 6PLY STRL (MISCELLANEOUS) IMPLANT
BRA SURGICAL XLRG (MISCELLANEOUS) ×3 IMPLANT
BULB RESERV EVAC DRAIN JP 100C (MISCELLANEOUS) IMPLANT
CANISTER SUCT 1200ML W/VALVE (MISCELLANEOUS) ×3 IMPLANT
CHLORAPREP W/TINT 26ML (MISCELLANEOUS) ×3 IMPLANT
CLOSURE WOUND 1/2 X4 (GAUZE/BANDAGES/DRESSINGS) ×1
CNTNR SPEC 2.5X3XGRAD LEK (MISCELLANEOUS) ×2
CONT SPEC 4OZ STER OR WHT (MISCELLANEOUS) ×4
CONTAINER SPEC 2.5X3XGRAD LEK (MISCELLANEOUS) ×2 IMPLANT
COVER PROBE FLX POLY STRL (MISCELLANEOUS) ×3 IMPLANT
DRAIN CHANNEL JP 15F RND 16 (MISCELLANEOUS) IMPLANT
DRAPE LAPAROTOMY TRNSV 106X77 (MISCELLANEOUS) ×3 IMPLANT
DRSG TELFA 3X8 NADH (GAUZE/BANDAGES/DRESSINGS) ×3 IMPLANT
ELECT CAUTERY BLADE TIP 2.5 (TIP) ×3
ELECT REM PT RETURN 9FT ADLT (ELECTROSURGICAL) ×3
ELECTRODE CAUTERY BLDE TIP 2.5 (TIP) ×1 IMPLANT
ELECTRODE REM PT RTRN 9FT ADLT (ELECTROSURGICAL) ×1 IMPLANT
GAUZE FLUFF 18X24 1PLY STRL (GAUZE/BANDAGES/DRESSINGS) ×3 IMPLANT
GAUZE SPONGE 4X4 12PLY STRL (GAUZE/BANDAGES/DRESSINGS) IMPLANT
GLOVE BIO SURGEON STRL SZ7.5 (GLOVE) ×9 IMPLANT
GLOVE INDICATOR 8.0 STRL GRN (GLOVE) ×9 IMPLANT
GOWN STRL REUS W/ TWL LRG LVL3 (GOWN DISPOSABLE) ×3 IMPLANT
GOWN STRL REUS W/TWL LRG LVL3 (GOWN DISPOSABLE) ×6
HARMONIC SCALPEL FOCUS (MISCELLANEOUS) IMPLANT
KIT RM TURNOVER STRD PROC AR (KITS) ×3 IMPLANT
LABEL OR SOLS (LABEL) ×3 IMPLANT
MARGIN MAP 10MM (MISCELLANEOUS) ×3 IMPLANT
NDL SAFETY 22GX1.5 (NEEDLE) ×3 IMPLANT
NEEDLE HYPO 25X1 1.5 SAFETY (NEEDLE) ×3 IMPLANT
PACK BASIN MINOR ARMC (MISCELLANEOUS) ×3 IMPLANT
SHEARS FOC LG CVD HARMONIC 17C (MISCELLANEOUS) IMPLANT
STRIP CLOSURE SKIN 1/2X4 (GAUZE/BANDAGES/DRESSINGS) ×2 IMPLANT
SUT ETHILON 3-0 FS-10 30 BLK (SUTURE) ×3
SUT SILK 2 0 (SUTURE)
SUT SILK 2-0 18XBRD TIE 12 (SUTURE) IMPLANT
SUT VIC AB 2-0 CT1 27 (SUTURE) ×4
SUT VIC AB 2-0 CT1 TAPERPNT 27 (SUTURE) ×2 IMPLANT
SUT VIC AB 3-0 SH 27 (SUTURE) ×2
SUT VIC AB 3-0 SH 27X BRD (SUTURE) ×1 IMPLANT
SUT VIC AB 4-0 FS2 27 (SUTURE) ×3 IMPLANT
SUT VICRYL+ 3-0 144IN (SUTURE) ×3 IMPLANT
SUTURE EHLN 3-0 FS-10 30 BLK (SUTURE) ×1 IMPLANT
SWABSTK COMLB BENZOIN TINCTURE (MISCELLANEOUS) ×3 IMPLANT
SYR BULB IRRIG 60ML STRL (SYRINGE) IMPLANT
SYR CONTROL 10ML (SYRINGE) ×3 IMPLANT
SYRINGE 10CC LL (SYRINGE) ×3 IMPLANT
TAPE TRANSPORE STRL 2 31045 (GAUZE/BANDAGES/DRESSINGS) IMPLANT
WATER STERILE IRR 1000ML POUR (IV SOLUTION) ×3 IMPLANT

## 2016-09-06 NOTE — Transfer of Care (Signed)
Immediate Anesthesia Transfer of Care Note  Patient: Karina Martin  Procedure(s) Performed: Procedure(s): BREAST LUMPECTOMY (Right)  Patient Location: PACU  Anesthesia Type:General  Level of Consciousness: sedated  Airway & Oxygen Therapy: Patient Spontanous Breathing and Patient connected to face mask oxygen  Post-op Assessment: Report given to RN and Post -op Vital signs reviewed and stable  Post vital signs: Reviewed and stable  Last Vitals:  Vitals:   09/06/16 1043 09/06/16 1350  BP: 119/69 122/76  Pulse: (!) 57 94  Resp: 14 20  Temp: 36.6 C 78.6 C    Complications: No apparent anesthesia complications

## 2016-09-06 NOTE — H&P (Signed)
No change in clinical history or exam.  

## 2016-09-06 NOTE — Anesthesia Postprocedure Evaluation (Signed)
Anesthesia Post Note  Patient: Karina Martin  Procedure(s) Performed: Procedure(s) (LRB): BREAST LUMPECTOMY (Right)  Patient location during evaluation: PACU Anesthesia Type: General Level of consciousness: awake and alert Pain management: pain level controlled Vital Signs Assessment: post-procedure vital signs reviewed and stable Respiratory status: spontaneous breathing and respiratory function stable Cardiovascular status: stable Anesthetic complications: no     Last Vitals:  Vitals:   09/06/16 1043 09/06/16 1350  BP: 119/69 122/76  Pulse: (!) 57 94  Resp: 14 20  Temp: 36.6 C 36.4 C    Last Pain:  Vitals:   09/06/16 1043  TempSrc: Oral                 Khalel Alms K

## 2016-09-06 NOTE — Anesthesia Post-op Follow-up Note (Cosign Needed)
Anesthesia QCDR form completed.        

## 2016-09-06 NOTE — Discharge Instructions (Signed)

## 2016-09-06 NOTE — Anesthesia Preprocedure Evaluation (Signed)
Anesthesia Evaluation  Patient identified by MRN, date of birth, ID band Patient awake    Reviewed: Allergy & Precautions, NPO status , Patient's Chart, lab work & pertinent test results  History of Anesthesia Complications Negative for: history of anesthetic complications  Airway Mallampati: II       Dental  (+) Loose,    Pulmonary neg pulmonary ROS,           Cardiovascular + dysrhythmias (palpatations) + Valvular Problems/Murmurs (murmur, no tx)      Neuro/Psych negative neurological ROS     GI/Hepatic negative GI ROS, Neg liver ROS,   Endo/Other  negative endocrine ROS  Renal/GU negative Renal ROS     Musculoskeletal   Abdominal   Peds  Hematology  (+) anemia ,   Anesthesia Other Findings   Reproductive/Obstetrics                             Anesthesia Physical Anesthesia Plan  ASA: II  Anesthesia Plan: General   Post-op Pain Management:    Induction: Intravenous  Airway Management Planned: LMA  Additional Equipment:   Intra-op Plan:   Post-operative Plan:   Informed Consent: I have reviewed the patients History and Physical, chart, labs and discussed the procedure including the risks, benefits and alternatives for the proposed anesthesia with the patient or authorized representative who has indicated his/her understanding and acceptance.     Plan Discussed with:   Anesthesia Plan Comments:         Anesthesia Quick Evaluation

## 2016-09-06 NOTE — Op Note (Signed)
Preoperative diagnosis: Fibroepithelial lesion right breast, 6:00. 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  Postoperative diagnosis: Same.  Operative procedure: Excision of right breast lesions.  Operating surgeon: Ollen Bowl, M.D.  Anesthesia: Gen. by LMA, Marcaine 0.5% with 1-200,000 epinephrine, 13 mL.  Estimated blood loss: 5 mL.  Clinical note: This 41 year old wound was noted to have a mass in the right breast and core biopsy suggested a fibroepithelial lesion for which excision was recommended to confirm no phyllodes tumor. A second lesion had been stable over time but it was elected to proceed to excision as she was going to be placed under general anesthesia for excision of the primary lesion.  R Purnell: With the patient under adequate general anesthesia the right breast was prepped with ChloraPrep and draped. The lesions were identified on ultrasound. Local anesthesia was infiltrated. The lesion at the 9:00 position, 7 cm from the nipple was removed first. A radial incision was made and carried aphthous skin and subcutis tissue. A 1 cm mass was excised, orientated and sent for routine histology.  The lesion at the 6:00 position was confirmed with identification of the biopsy clip within the lesion. A repair of circumareolar incision was made from the 5:00 position. Skin was incised sharply and the remaining dissection completed with electrocautery. The mass was grasped with Allis clamps and extracted. The specimen was orientated and sent for routine histology. The deep tissue was approximated with 2-0 Vicryl figure-of-eight sutures at both sites. The skin was closed with a running 4-0 Vicryl subcuticular suture at both sites. Benzoin Steri-Strips followed by a Telfa pad was applied. Surgical bra and fluff gauze placed.  The patient tolerated the procedure well and was brought to the recovery room in stable condition.

## 2016-09-06 NOTE — Anesthesia Procedure Notes (Signed)
Procedure Name: LMA Insertion Date/Time: 09/06/2016 12:44 PM Performed by: Johnna Acosta Pre-anesthesia Checklist: Patient identified, Emergency Drugs available, Suction available, Patient being monitored and Timeout performed Patient Re-evaluated:Patient Re-evaluated prior to inductionOxygen Delivery Method: Circle system utilized Preoxygenation: Pre-oxygenation with 100% oxygen Intubation Type: IV induction LMA: LMA inserted LMA Size: 4.0 Tube type: Oral Number of attempts: 1 Placement Confirmation: positive ETCO2 and breath sounds checked- equal and bilateral Tube secured with: Tape Dental Injury: Teeth and Oropharynx as per pre-operative assessment

## 2016-09-07 ENCOUNTER — Encounter: Payer: Self-pay | Admitting: General Surgery

## 2016-09-07 LAB — SURGICAL PATHOLOGY

## 2016-09-08 ENCOUNTER — Telehealth: Payer: Self-pay

## 2016-09-08 NOTE — Telephone Encounter (Signed)
-----   Message from Robert Bellow, MD sent at 09/08/2016  8:38 AM EDT ----- Please notify the patient that the pathology was benign. I would like to see her in 1 year with bilateral screening mammograms at that time. Thank you ----- Message ----- From: Interface, Lab In Three Zero One Sent: 09/07/2016   4:29 PM To: Robert Bellow, MD

## 2016-09-08 NOTE — Telephone Encounter (Signed)
Notified patient as instructed, patient pleased. Discussed follow-up appointments, patient agrees  

## 2016-09-13 ENCOUNTER — Ambulatory Visit: Payer: BC Managed Care – PPO | Admitting: General Surgery

## 2016-09-15 ENCOUNTER — Ambulatory Visit (INDEPENDENT_AMBULATORY_CARE_PROVIDER_SITE_OTHER): Payer: BC Managed Care – PPO | Admitting: General Surgery

## 2016-09-15 ENCOUNTER — Encounter: Payer: Self-pay | Admitting: General Surgery

## 2016-09-15 VITALS — BP 124/72 | HR 72 | Resp 12 | Ht 64.0 in | Wt 199.0 lb

## 2016-09-15 DIAGNOSIS — N6310 Unspecified lump in the right breast, unspecified quadrant: Secondary | ICD-10-CM

## 2016-09-15 NOTE — Patient Instructions (Signed)
  Follow up in one year. Call the office with any issues.

## 2016-09-15 NOTE — Progress Notes (Signed)
Patient ID: Karina Martin, female   DOB: 01-07-1976, 41 y.o.   MRN: [id number]  Chief Complaint  Patient presents with  . Routine Post Op    rigth breast lumpectomy    HPI Karina Martin is a 41 y.o. female is here today for a one week right breast lumpectomy follow up. Patient states she is doing well. She did not need the pain medication. The patient had had a screening mammogram completed in February 2017. At that time densities were identified in the right breast and she underwent a diagnostic study which time a 1.2 cm mass was identified in the lower central right breast and 1 cm mass in the outer midportion of the breast. Six-month follow-up exam was recommended, but the patient returned in March 2018 at which time ultrasound determined the lesion had increased in size. She subsequently underwent radiology directed ultrasound biopsy 20 days after the diagnostic study. The biopsies suggested a fibroepithelial lesion with moderate cellularity for which complete excision was recommended.  This was up swelling completed on 09/06/2016 and at the same time the smaller lesion lateral aspect of the right breast was removed as well. HPI  Past Medical History:  Diagnosis Date  . Anemia    H/O WHILE ON HER PERIOD  . Heart palpitations 2017   PT STATES THAT SHE SAW A CARDIOLOGIST AT [facility] IN [place] AND WORE A HOLTER MONITOR WHICH OVERALL SHOWED UP NORMAL-SHE WAS INSTRUCTED TO CUT BACK ON CAFFEINE AND PALPITATIONS HAVE DECREASED GREATLY  . Murmur    ASYMPTOMATIC    Past Surgical History:  Procedure Laterality Date  . BREAST BIOPSY Right 08/04/2016   FIBROEPITHELIAL LESION WITH MODERATELY CELLULAR PROMINENT STROMAL   . BREAST LUMPECTOMY Right 09/06/2016   Procedure: BREAST LUMPECTOMY;  Surgeon: Robert Bellow, MD;  Location: [facility] ORS;  Service: General;  Laterality: Right;    Family History  Problem Relation Age of Onset  . Hypertension Mother   . Diabetes Father   .  Breast cancer Cousin     second cousin on fathers side  . Colon cancer Neg Hx     Social History Social History  Substance Use Topics  . Smoking status: Never Smoker  . Smokeless tobacco: Never Used  . Alcohol use Yes     Comment: wine 4-5 X WEEKLY    No Known Allergies  Current Outpatient Prescriptions  Medication Sig Dispense Refill  . cetirizine (ZYRTEC) 10 MG tablet Take 10 mg by mouth daily as needed for allergies.    Marland Kitchen ibuprofen (ADVIL,MOTRIN) 200 MG tablet Take 600 mg by mouth every 6 (six) hours as needed for headache.    Marland Kitchen HYDROcodone-acetaminophen (NORCO) 5-325 MG tablet Take 1-2 tablets by mouth every 4 (four) hours as needed for moderate pain. 30 tablet 0   No current facility-administered medications for this visit.     Review of Systems Review of Systems  Constitutional: Negative.   Respiratory: Negative.   Cardiovascular: Negative.     Blood pressure 124/72, pulse 72, resp. rate 12, height 5\' 4"  (1.626 m), weight 199 lb (90.3 kg), last menstrual period 08/21/2016.  Physical Exam Physical Exam  Constitutional: She is oriented to person, place, and time. She appears well-developed and well-nourished.  Pulmonary/Chest:  Well healed incision sites   Neurological: She is alert and oriented to person, place, and time.  Skin: Skin is warm and dry.    Data Reviewed Pathology from the April 24 surgical excision: DIAGNOSIS:  A. BREAST  LESION, RIGHT, 9:00; EXCISION:  - FIBROADENOMA, 0.6 CM.   B. BREAST LESION, RIGHT, 6:00; EXCISION:  - CONSISTENT WITH BENIGN PHYLLODES TUMOR, 1.5 CM.  - THE MASS APPEARS TO BE COMPLETELY EXCISED.    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 Fibroadenoma of the right breast, excised.    Plan    We'll arrange for a follow-up mammogram in 1 year to reassess the breast with office visit to follow. This will be a screening study as the patient is presently without pathologic abnormalities.   Follow  up in one year. Call the office with any issues.   HPI, Physical Exam, Assessment and Plan have been scribed under the direction and in the presence of Hervey Ard, MD.  Verlene Mayer, CMA  I have completed the exam and reviewed the above documentation for accuracy and completeness.  I agree with the above.  Haematologist has been used and any errors in dictation or transcription are unintentional.  Hervey Ard, M.D., F.A.C.S.        Robert Bellow 09/16/2016, 8:39 AM

## 2016-09-19 ENCOUNTER — Telehealth: Payer: Self-pay

## 2016-09-19 NOTE — Telephone Encounter (Signed)
-----   Message from Robert Bellow, MD sent at 09/16/2016  8:39 AM EDT ----- Please arrange for bilateral screening mammograms prior to next years appointment. Thanks.  Please let her know now that is the plan. Thanks.

## 2017-07-20 ENCOUNTER — Encounter: Payer: Self-pay | Admitting: Obstetrics and Gynecology

## 2017-07-20 ENCOUNTER — Ambulatory Visit (INDEPENDENT_AMBULATORY_CARE_PROVIDER_SITE_OTHER): Payer: BC Managed Care – PPO | Admitting: Obstetrics and Gynecology

## 2017-07-20 VITALS — BP 118/74 | Ht 64.0 in | Wt 200.0 lb

## 2017-07-20 DIAGNOSIS — Z1331 Encounter for screening for depression: Secondary | ICD-10-CM | POA: Diagnosis not present

## 2017-07-20 DIAGNOSIS — Z01419 Encounter for gynecological examination (general) (routine) without abnormal findings: Secondary | ICD-10-CM

## 2017-07-20 DIAGNOSIS — Z1339 Encounter for screening examination for other mental health and behavioral disorders: Secondary | ICD-10-CM

## 2017-07-20 NOTE — Progress Notes (Signed)
Gynecology Annual Exam   PCP: Maeola Sarah, MD   Chief Complaint  Patient presents with  . Annual Exam   History of Present Illness:  Ms. Karina Martin is a 42 y.o. [id number] who LMP was Patient's last menstrual period was 06/26/2017., presents today for her annual examination.  Her menses are regular every 28-30 days, lasting 4 day(s).  Dysmenorrhea none. She does not have intermenstrual bleeding.  She is single partner, contraception - none.  Last Pap: 2 years ago  Results were: no abnormalities /neg HPV DNA Negative Hx of STDs: none  Last mammogram: 1 year ago  Results were: mass seen and removed. Fibroadenoma on pathology. She is to follow up with Dr. Bary Castilla this year.  This was on her right breast.  There is no FH of breast cancer. There is no FH of ovarian cancer. The patient does not do self-breast exams.  Tobacco use: The patient denies current or previous tobacco use. Alcohol use: social drinker Exercise: moderately active  The patient wears seatbelts: yes.      In the past year she had some palpatiations. She had an EKG and it showed sinus bradycardia. She had a chest xray, which was normal. She states she had normal labs. She was supposed to have an ECHO, but has not been scheduled.  She has had no issues since.  Her heart rate was 41 bpm.  p  Past Medical History:  Diagnosis Date  . Anemia    H/O WHILE ON HER PERIOD  . Heart palpitations 2017   PT STATES THAT SHE SAW A CARDIOLOGIST AT [facility] IN [place] AND WORE A HOLTER MONITOR WHICH OVERALL SHOWED UP NORMAL-SHE WAS INSTRUCTED TO CUT BACK ON CAFFEINE AND PALPITATIONS HAVE DECREASED GREATLY  . Murmur    ASYMPTOMATIC    Past Surgical History:  Procedure Laterality Date  . BREAST BIOPSY Right 08/04/2016   FIBROEPITHELIAL LESION WITH MODERATELY CELLULAR PROMINENT STROMAL   . BREAST BIOPSY    . BREAST LUMPECTOMY Right 09/06/2016   Procedure: BREAST LUMPECTOMY;  Surgeon: Robert Bellow, MD;   Location: [facility] ORS;  Service: General;  Laterality: Right;    Prior to Admission medications   Medication Sig Start Date End Date Taking? Authorizing Provider  cetirizine (ZYRTEC) 10 MG tablet Take 10 mg by mouth daily as needed for allergies.    [provider]  ibuprofen (ADVIL,MOTRIN) 200 MG tablet Take 600 mg by mouth every 6 (six) hours as needed for headache.    [provider]   Allergies: No Known Allergies  Gynecologic History: Patient's last menstrual period was 06/26/2017.  Obstetric History: [id number]  Social History   Socioeconomic History  . Marital status: Married    Spouse name: Not on file  . Number of children: Not on file  . Years of education: Not on file  . Highest education level: Not on file  Social Needs  . Financial resource strain: Not on file  . Food insecurity - worry: Not on file  . Food insecurity - inability: Not on file  . Transportation needs - medical: Not on file  . Transportation needs - non-medical: Not on file  Occupational History  . Not on file  Tobacco Use  . Smoking status: Never Smoker  . Smokeless tobacco: Never Used  Substance and Sexual Activity  . Alcohol use: Yes    Comment: wine 4-5 X WEEKLY  . Drug use: No  . Sexual activity: Yes    Birth  control/protection: None  Other Topics Concern  . Not on file  Social History Narrative  . Not on file    Family History  Problem Relation Age of Onset  . Hypertension Mother   . Diabetes Father   . Breast cancer Cousin        second cousin on fathers side  . Colon cancer Neg Hx     Review of Systems  Constitutional: Negative.   HENT: Negative.   Eyes: Negative.   Respiratory: Negative.   Cardiovascular: Negative.   Gastrointestinal: Negative.   Genitourinary: Negative.   Musculoskeletal: Negative.   Skin: Negative.   Neurological: Negative.   Psychiatric/Behavioral: Negative.      Physical Exam BP 118/74   Ht 5\' 4"  (1.626 m)   Wt 200 lb (90.7 kg)    LMP 06/26/2017   BMI 34.33 kg/m    Physical Exam  Constitutional: She is oriented to person, place, and time. She appears well-developed and well-nourished. No distress.  Genitourinary: Uterus normal. Pelvic exam was performed with patient supine. There is no rash, tenderness, lesion or injury on the right labia. There is no rash, tenderness, lesion or injury on the left labia. No erythema, tenderness or bleeding in the vagina. No signs of injury around the vagina. No vaginal discharge found. Right adnexum does not display mass, does not display tenderness and does not display fullness. Left adnexum does not display mass, does not display tenderness and does not display fullness. Cervix does not exhibit motion tenderness, lesion, discharge or polyp.   Uterus is mobile and anteverted. Uterus is not enlarged, tender or exhibiting a mass.  HENT:  Head: Normocephalic and atraumatic.  Eyes: EOM are normal. No scleral icterus.  Neck: Normal range of motion. Neck supple. No thyromegaly present.  Cardiovascular: Normal rate and regular rhythm. Exam reveals no gallop and no friction rub.  No murmur heard. Pulmonary/Chest: Effort normal and breath sounds normal. No respiratory distress. She has no wheezes. She has no rales. Right breast exhibits no inverted nipple, no mass, no nipple discharge, no skin change and no tenderness. Left breast exhibits no inverted nipple, no mass, no nipple discharge, no skin change and no tenderness.  Abdominal: Soft. Bowel sounds are normal. She exhibits no distension and no mass. There is no tenderness. There is no rebound and no guarding.  Musculoskeletal: Normal range of motion. She exhibits no edema or tenderness.  Lymphadenopathy:    She has no cervical adenopathy.       Right: No inguinal adenopathy present.       Left: No inguinal adenopathy present.  Neurological: She is alert and oriented to person, place, and time. No cranial nerve deficit.  Skin: Skin is  warm and dry. No rash noted. No erythema.  Psychiatric: She has a normal mood and affect. Her behavior is normal. Judgment normal.   Female chaperone present for pelvic and breast  portions of the physical exam  Results: AUDIT Questionnaire (screen for alcoholism): 3 PHQ-9: 1   Assessment: 42 y.o. [id number] female here for routine annual gynecologic examination.  Plan: Problem List Items Addressed This Visit    None    Visit Diagnoses    Women's annual routine gynecological examination    -  Primary   Screening for depression       Screening for alcoholism         Screening: -- Blood pressure screen normal -- Colonoscopy - not due -- Mammogram - due. Patient to call [facility] to  arrange. She understands that it is her responsibility to arrange this. -- Weight screening: obese: discussed management options, including lifestyle, dietary, and exercise. -- Depression screening negative (PHQ-9) -- Nutrition: normal -- cholesterol screening: not due for screening -- osteoporosis screening: not due -- tobacco screening: not using -- alcohol screening: AUDIT questionnaire indicates low-risk usage. -- family history of breast cancer screening: done. not at high risk. -- no evidence of domestic violence or intimate partner violence. -- STD screening: gonorrhea/chlamydia NAAT not collected per patient request. -- pap smear not collected per ASCCP guidelines -- flu vaccine did receive at work at [employer] [facility] [employer] [employer] -- HPV vaccination series: not Lanetta Inch, MD 07/20/2017 9:06 AM

## 2017-07-24 ENCOUNTER — Telehealth: Payer: Self-pay

## 2017-07-24 NOTE — Telephone Encounter (Signed)
Pt needs order for mammogram sent to [facility].  [phone]

## 2017-07-25 NOTE — Telephone Encounter (Signed)
Can you find out what sort of mammogram order she needs?  I'm not quite sure. Thank you!

## 2017-07-26 ENCOUNTER — Other Ambulatory Visit: Payer: Self-pay | Admitting: Obstetrics and Gynecology

## 2017-07-26 DIAGNOSIS — Z1231 Encounter for screening mammogram for malignant neoplasm of breast: Secondary | ICD-10-CM

## 2017-07-26 NOTE — Telephone Encounter (Signed)
Patient is aware of appointment on Tuesday, 08/08/17 @ 1:20pm at [facility].

## 2017-08-08 ENCOUNTER — Other Ambulatory Visit: Payer: Self-pay | Admitting: Obstetrics and Gynecology

## 2017-08-08 ENCOUNTER — Ambulatory Visit
Admission: RE | Admit: 2017-08-08 | Discharge: 2017-08-08 | Disposition: A | Discharge status: Home / Self Care | Payer: BC Managed Care – PPO | Source: Ambulatory Visit | Attending: Obstetrics and Gynecology | Admitting: Obstetrics and Gynecology

## 2017-08-08 DIAGNOSIS — Z1231 Encounter for screening mammogram for malignant neoplasm of breast: Secondary | ICD-10-CM | POA: Diagnosis present

## 2017-09-12 ENCOUNTER — Ambulatory Visit: Payer: BC Managed Care – PPO | Admitting: General Surgery

## 2017-09-12 DIAGNOSIS — N6310 Unspecified lump in the right breast, unspecified quadrant: Secondary | ICD-10-CM

## 2017-10-25 ENCOUNTER — Encounter: Payer: Self-pay | Admitting: *Deleted

## 2018-02-19 IMAGING — MG MM DIGITAL DIAGNOSTIC BILAT W/ TOMO W/ CAD
8 of 12 series · 8 of 28 positions shown · non-contrast
Comparison: June 2015

CLINICAL DATA: Follow-up for probable fibroadenomas in the right
breast. The patient was seen in Wednesday June, 2015 for evaluation of 2
masses identified in the right breast the screening mammogram. Two
probable fibroadenomas were described on ultrasound of the right
breast. The patient was unable to present for a six-month follow-up
and presents today. She is asymptomatic.

EXAM:
2D DIGITAL DIAGNOSTIC BILATERAL MAMMOGRAM WITH CAD AND ADJUNCT TOMO
ULTRASOUND RIGHT BREAST

[L CC]
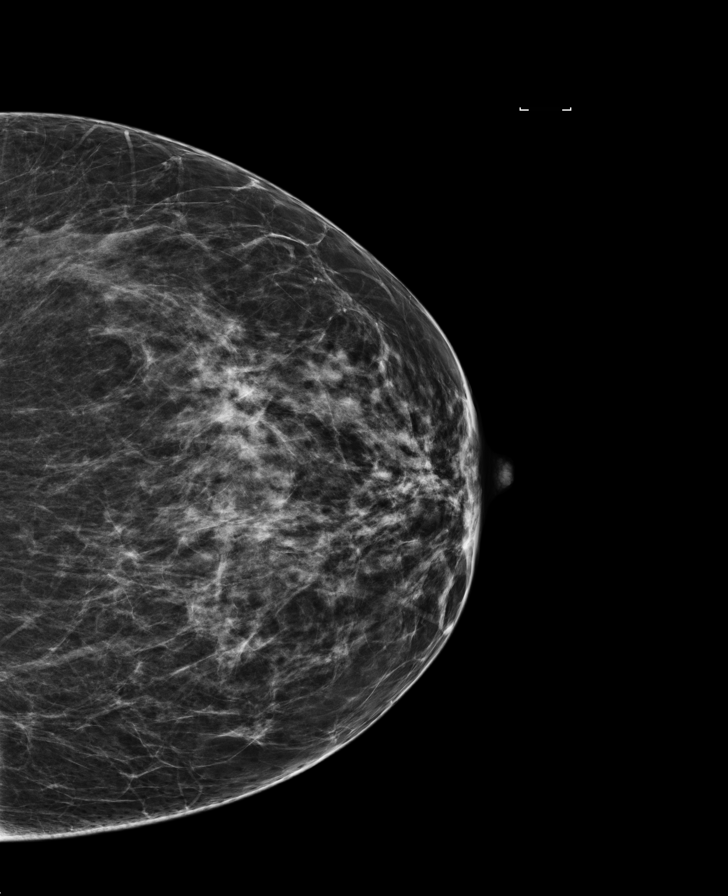

[L MLO]
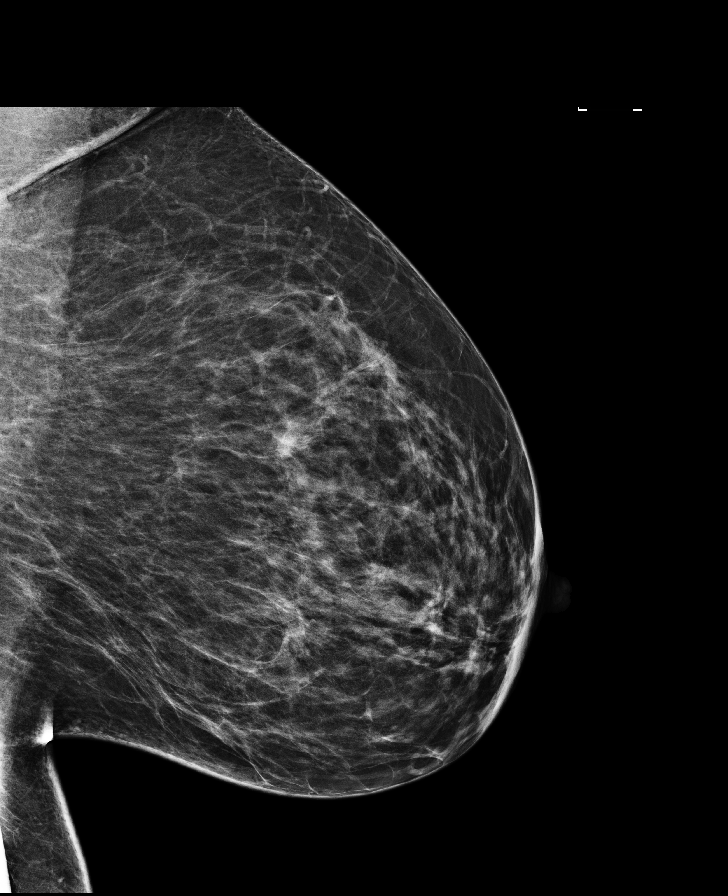

[L CC synth-2D]
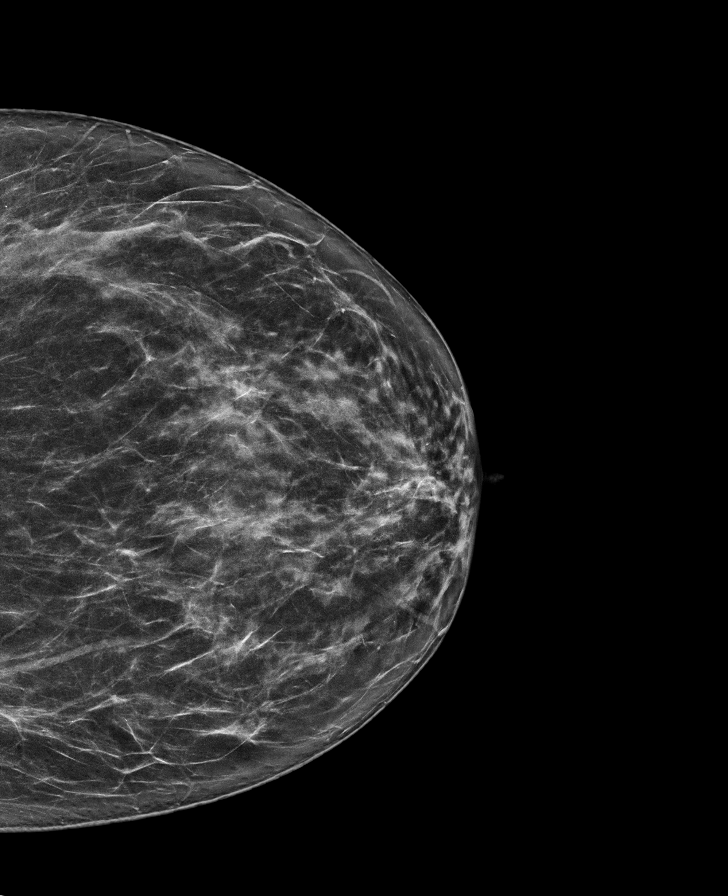

[R CC synth-2D]
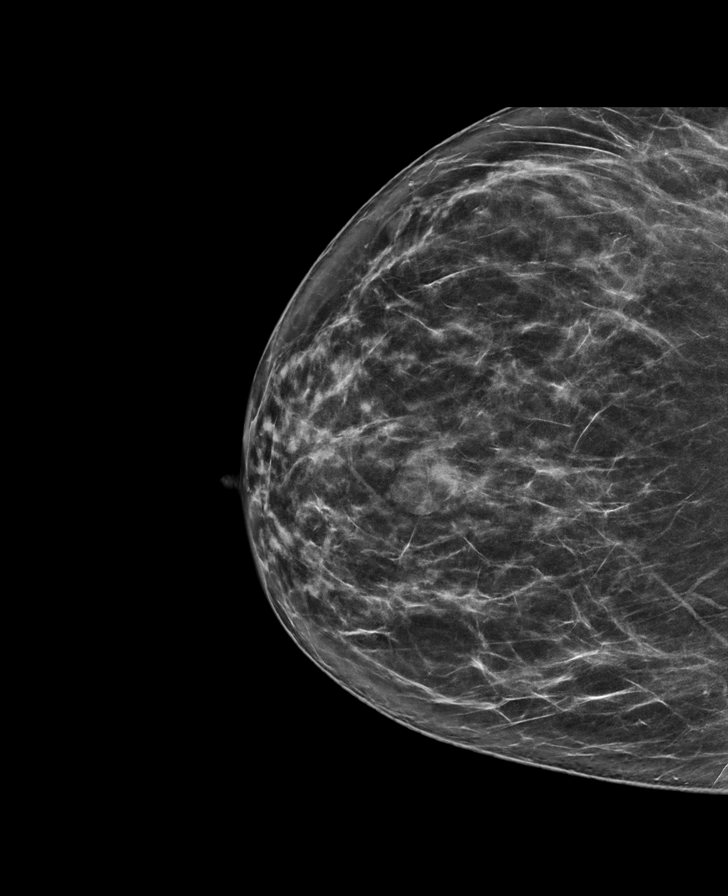

[L MLO synth-2D]
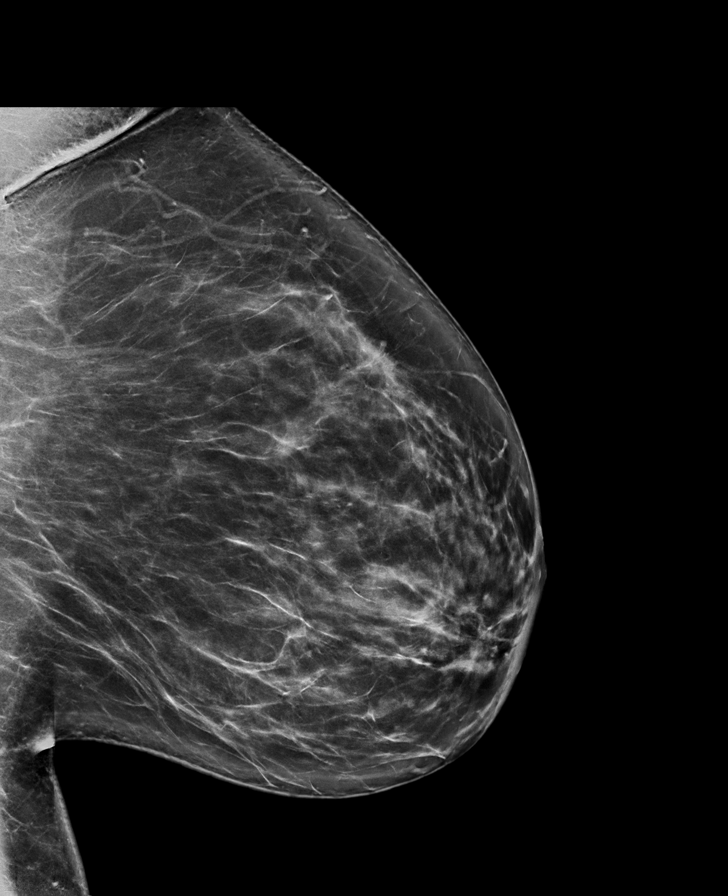

[R MLO synth-2D]
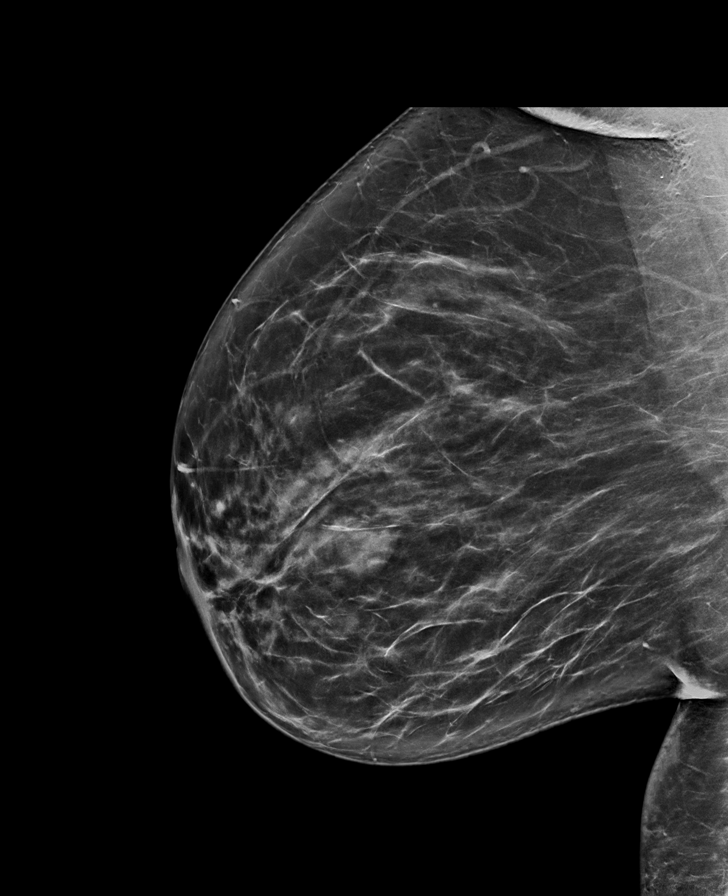

[R MLO]
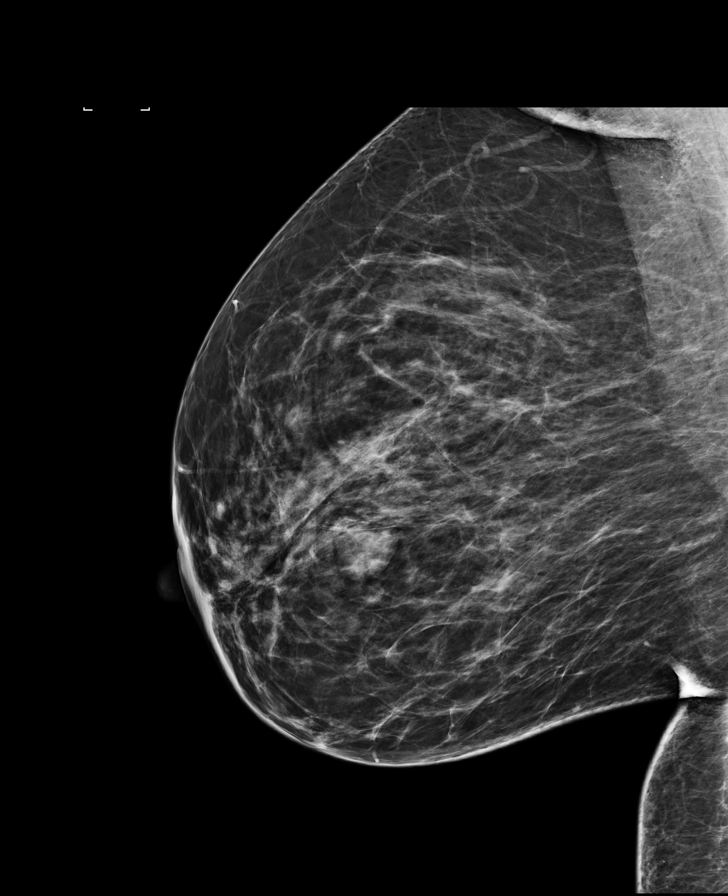

[R CC]
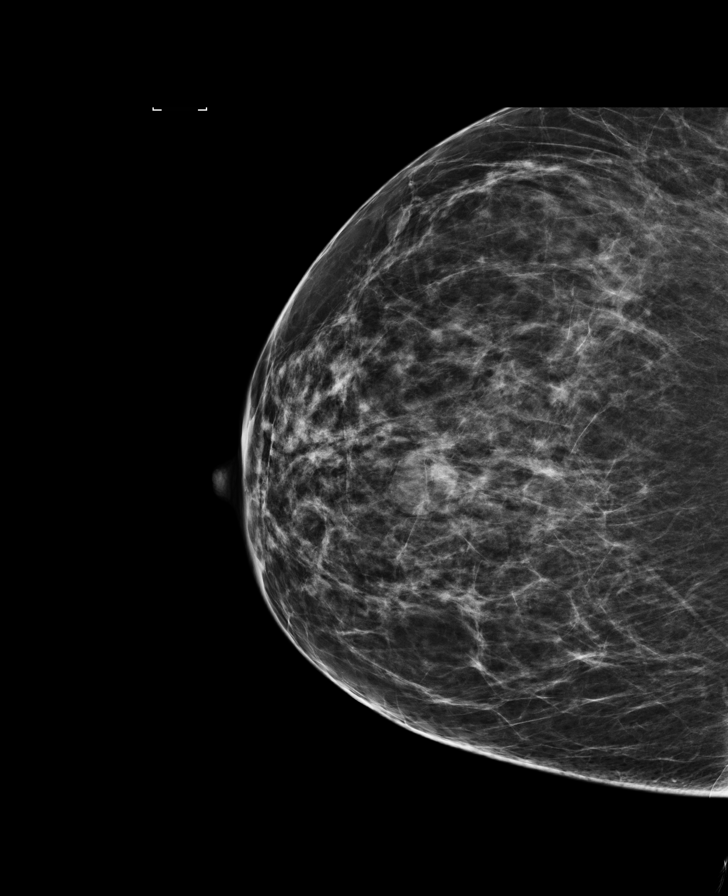

[8 of 28 positions shown; findings below may reference images not displayed]

ACR Breast Density Category b: There are scattered areas of
fibroglandular density.
FINDINGS: A circumscribed oval mass in the 6 o'clock region of the right
breast has increased in size since the mammogram of 07/07/2015.
Circumscribed oval mass 9 o'clock region of the right breast is
mammographically stable. No new mass is identified in the right
breast. No mass is identified in the left breast.

No architectural distortion or suspicious microcalcification is
identified in either breast.

Mammographic images were processed with CAD.

On physical exam, no mass is palpated in the 6 o'clock region of the
right breast.

Targeted ultrasound is performed, showing an oval hypoechoic gently
lobulated mass at 6 o'clock position right breast 3 cm from the
nipple measuring 2.0 x 0.8 x 1.8 cm. Previously, on the ultrasound
June 2015 this mass measured 1.3 x 0.6 x 1.2 cm. No internal
vascular flow is identified within the mass.

A circumscribed oval mass in the 9 o'clock position of the right
breast measures 0.8 x 1.1 x 0.4 cm and shows no internal vascular
flow. This mass is stable, previously measuring 0.8 x 1.2 x 0.4 cm.

Ultrasound the right axilla is negative for lymphadenopathy.
IMPRESSION: 1. This circumscribed oval solid mass in the 6 o'clock region of the
right breast has increased in size since June 2015. Although
imaging features remain compatible with fibroadenoma, malignancy
cannot be excluded and ultrasound-guided biopsy is suggested.
2. Stable probable fibroadenoma 9 o'clock position right breast.
3. No evidence of malignancy in the left breast.

RECOMMENDATION:
Ultrasound-guided biopsy of the right breast mass at 6 o'clock
position is recommended. The option of surgical excision was also
discussed with the patient. Our office will contact the ordering
physician's office with today's findings are recommendations.

I have discussed the findings and recommendations with the patient.
Results were also provided in writing at the conclusion of the
visit. If applicable, a reminder letter will be sent to the patient
regarding the next appointment.

BI-RADS CATEGORY  4: Suspicious.

## 2018-03-11 IMAGING — MG MM BREAST LOCALIZATION CLIP
2 series · 2 of 2 positions shown · non-contrast
Comparison: Previous exam(s).

CLINICAL DATA: Status post ultrasound-guided right breast biopsy

EXAM:
DIAGNOSTIC RIGHT MAMMOGRAM POST ULTRASOUND BIOPSY

[R CC]
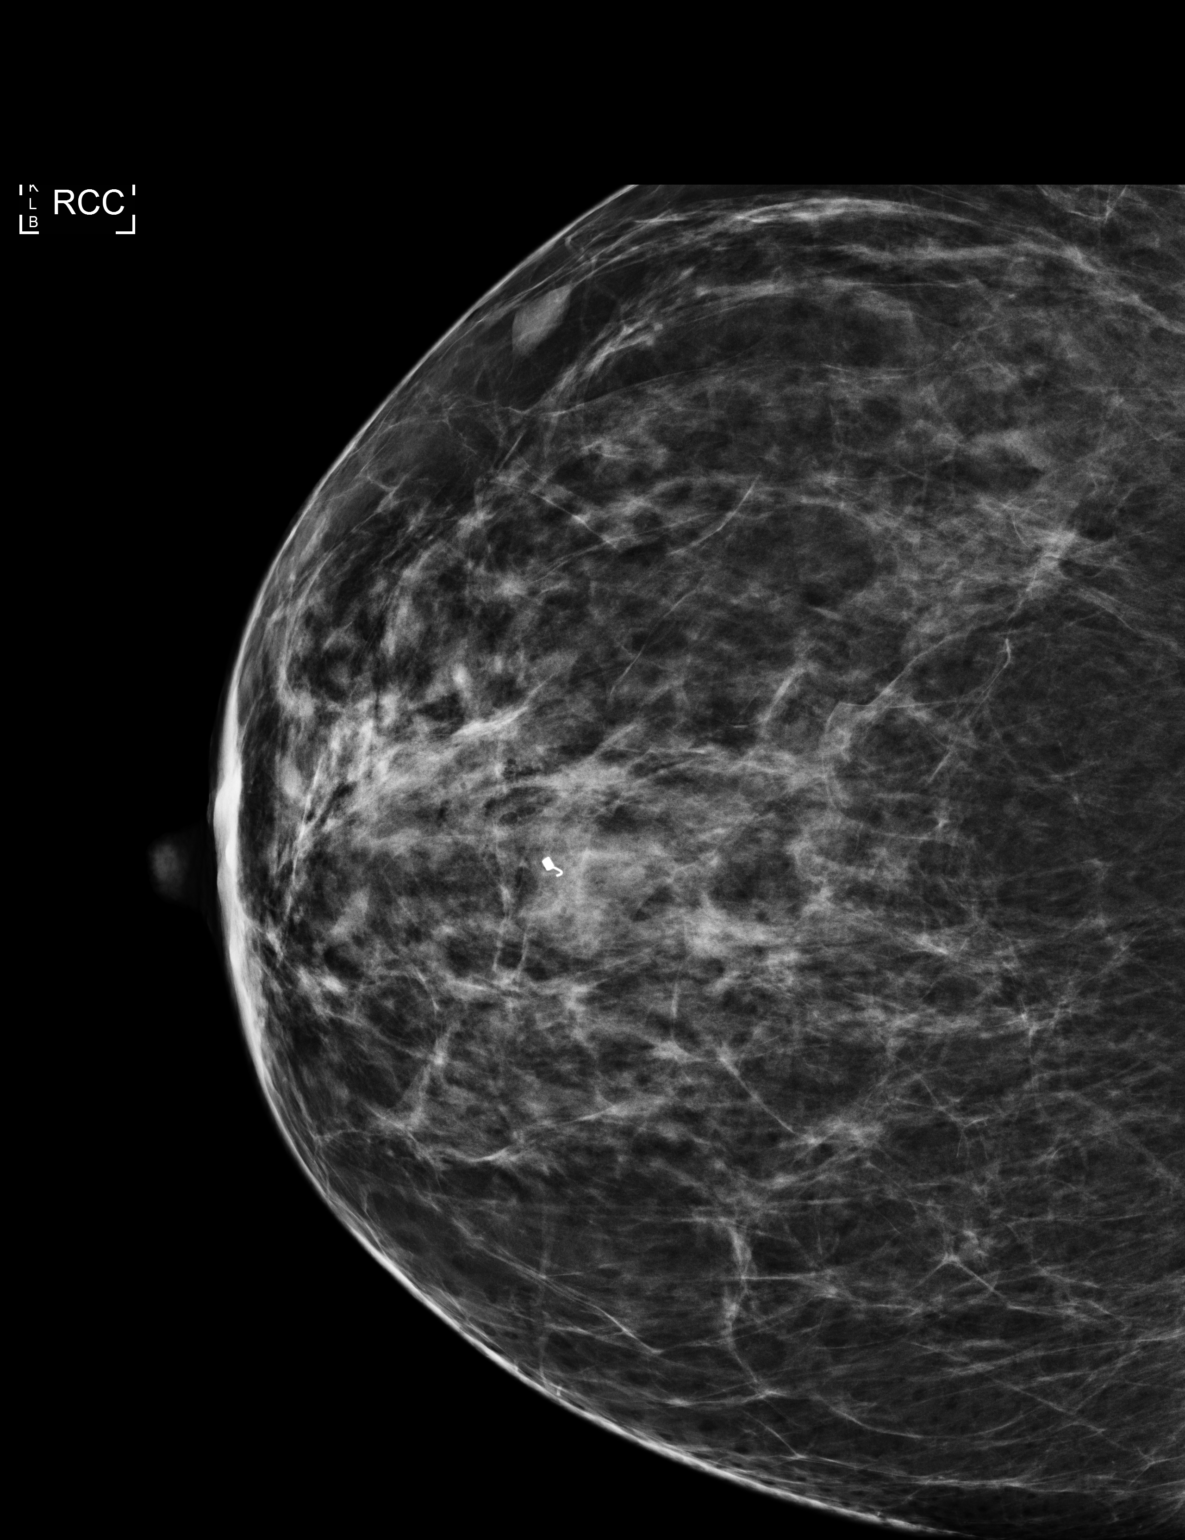

[R ML]
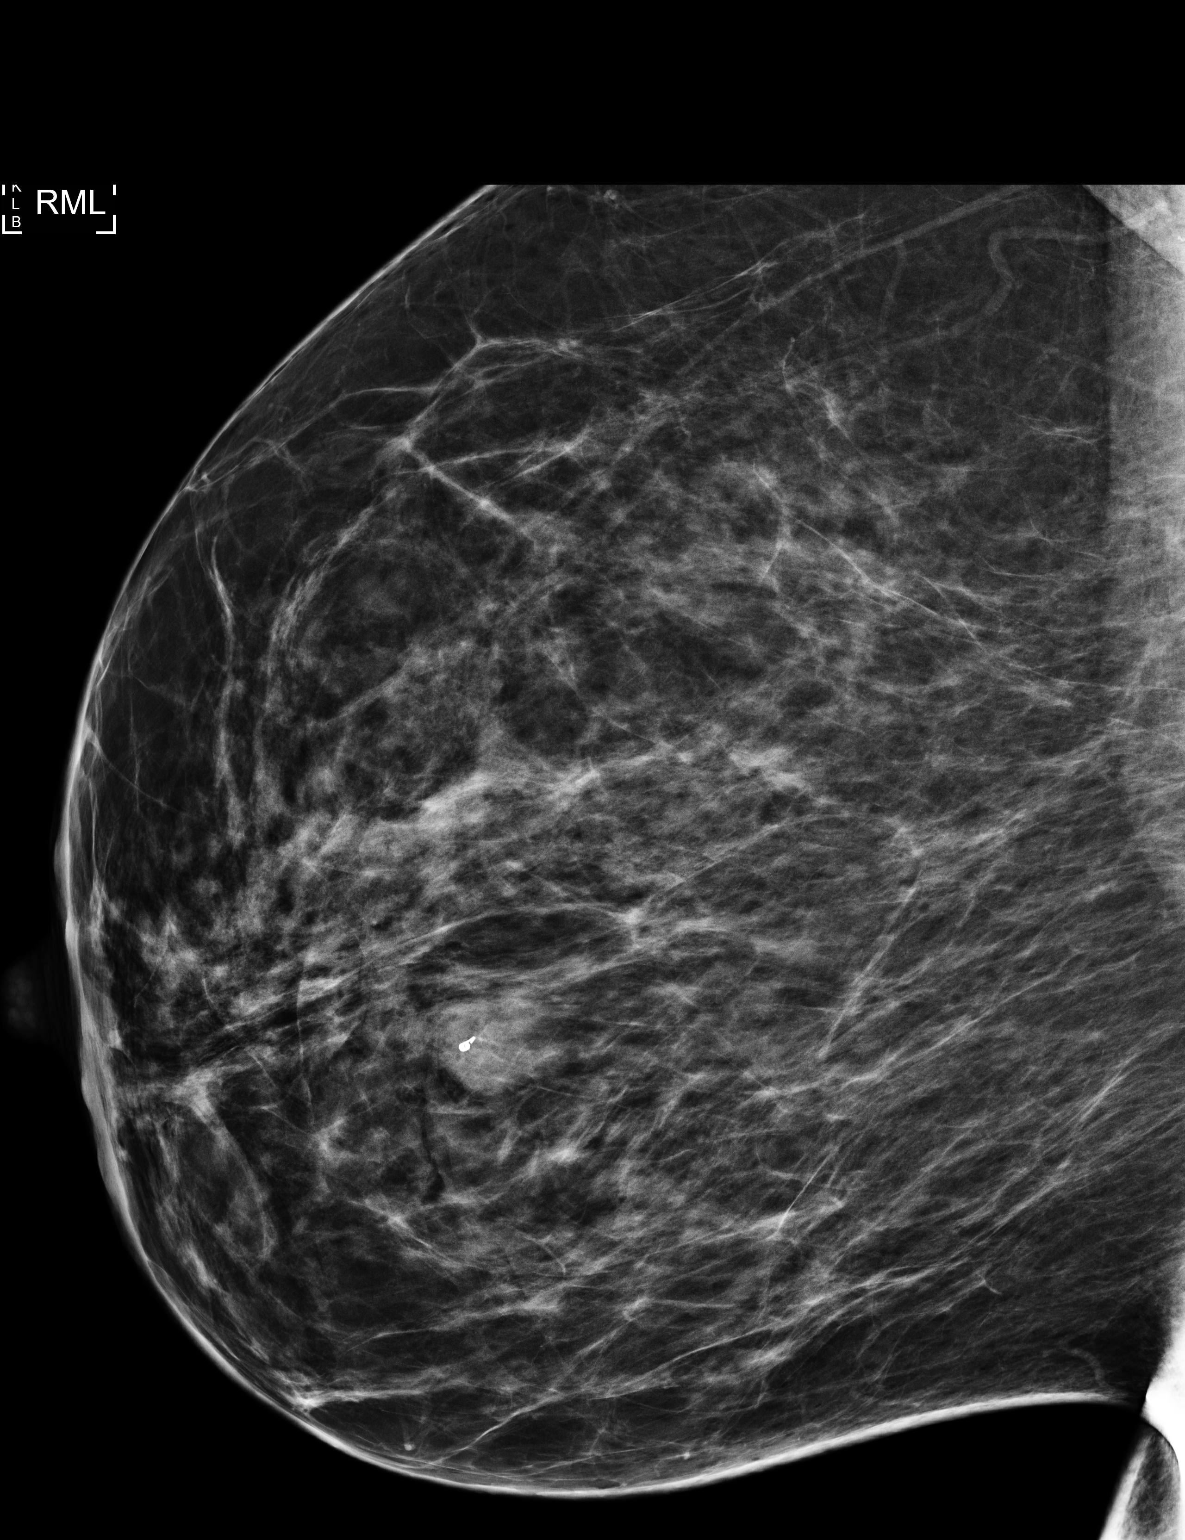

[2 of 2 positions shown; findings below may reference images not displayed]

FINDINGS: Mammographic images were obtained following ultrasound guided biopsy
of an indeterminate right breast mass at 6 o'clock, 3 cm from the
nipple. Post biopsy mammogram demonstrates the coil shaped biopsy
marker to be within the right breast mass at 6 o'clock.
IMPRESSION: Appropriate marker position as above.

Final Assessment: Post Procedure Mammograms for Marker Placement

## 2019-01-23 ENCOUNTER — Other Ambulatory Visit: Payer: Self-pay | Admitting: Obstetrics and Gynecology

## 2019-01-23 DIAGNOSIS — Z1231 Encounter for screening mammogram for malignant neoplasm of breast: Secondary | ICD-10-CM

## 2019-02-07 ENCOUNTER — Ambulatory Visit
Admission: RE | Admit: 2019-02-07 | Discharge: 2019-02-07 | Disposition: A | Discharge status: Home / Self Care | Payer: BC Managed Care – PPO | Source: Ambulatory Visit | Attending: Obstetrics and Gynecology | Admitting: Obstetrics and Gynecology

## 2019-02-07 ENCOUNTER — Encounter (INDEPENDENT_AMBULATORY_CARE_PROVIDER_SITE_OTHER): Payer: Self-pay

## 2019-02-07 ENCOUNTER — Other Ambulatory Visit: Payer: Self-pay

## 2019-02-07 DIAGNOSIS — Z1231 Encounter for screening mammogram for malignant neoplasm of breast: Secondary | ICD-10-CM | POA: Diagnosis present

## 2020-08-12 ENCOUNTER — Other Ambulatory Visit (HOSPITAL_COMMUNITY)
Admission: RE | Admit: 2020-08-12 | Discharge: 2020-08-12 | Disposition: A | Discharge status: Home / Self Care | Payer: BC Managed Care – PPO | Source: Ambulatory Visit | Attending: Obstetrics and Gynecology | Admitting: Obstetrics and Gynecology

## 2020-08-12 ENCOUNTER — Other Ambulatory Visit: Payer: Self-pay

## 2020-08-12 ENCOUNTER — Encounter: Payer: Self-pay | Admitting: Obstetrics and Gynecology

## 2020-08-12 ENCOUNTER — Ambulatory Visit (INDEPENDENT_AMBULATORY_CARE_PROVIDER_SITE_OTHER): Payer: BC Managed Care – PPO | Admitting: Obstetrics and Gynecology

## 2020-08-12 VITALS — BP 113/70 | Ht 63.5 in | Wt 178.0 lb

## 2020-08-12 DIAGNOSIS — Z01419 Encounter for gynecological examination (general) (routine) without abnormal findings: Secondary | ICD-10-CM | POA: Diagnosis present

## 2020-08-12 DIAGNOSIS — Z124 Encounter for screening for malignant neoplasm of cervix: Secondary | ICD-10-CM | POA: Diagnosis present

## 2020-08-12 DIAGNOSIS — Z1339 Encounter for screening examination for other mental health and behavioral disorders: Secondary | ICD-10-CM

## 2020-08-12 DIAGNOSIS — Z1331 Encounter for screening for depression: Secondary | ICD-10-CM

## 2020-08-12 NOTE — Progress Notes (Signed)
Gynecology Annual Exam  PCP: Maeola Sarah, MD  Chief Complaint:  Chief Complaint  Patient presents with  . Annual Exam    History of Present Illness:  Ms. Karina Martin is a 45 y.o. [id number] who LMP was Patient's last menstrual period was 07/22/2020., presents today for her annual examination.  Her menses are regular every 28-30 days, lasting 4 day(s).  Dysmenorrhea mild, occurring first 1-2 days of flow. She does not have intermenstrual bleeding.  She is sexually active.  She uses condoms for contraception most of the time.  Last Pap: 5 years  Results were: no abnormalities /neg HPV DNA negative Hx of STDs: none  There is no FH of breast cancer. There is no FH of ovarian cancer. The patient does do self-breast exams.  She does not have concerns. 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    Tobacco use: The patient denies current or previous tobacco use. Alcohol use: social drinker Exercise: very active, 3 x per week  The patient wears seatbelts: yes.   The patient reports that domestic violence in her life is absent.   Past Medical History:  Diagnosis Date  . Anemia    H/O WHILE ON HER PERIOD  . Heart palpitations 2017   PT STATES THAT SHE SAW A CARDIOLOGIST AT [facility] IN [place] AND WORE A HOLTER MONITOR WHICH OVERALL SHOWED UP NORMAL-SHE WAS INSTRUCTED TO CUT BACK ON CAFFEINE AND PALPITATIONS HAVE DECREASED GREATLY  . Murmur    ASYMPTOMATIC    Past Surgical History:  Procedure Laterality Date  . BREAST BIOPSY Right 08/04/2016   FIBROEPITHELIAL LESION WITH MODERATELY CELLULAR PROMINENT STROMAL   . BREAST EXCISIONAL BIOPSY Right 2018   benign  . BREAST LUMPECTOMY Right 09/06/2016   Procedure: BREAST LUMPECTOMY;  Surgeon: Robert Bellow, MD;  Location: [facility] ORS;  Service: General;  Laterality: Right;    Prior to Admission medications: Denies   Allergies: No Known Allergies   Obstetric History: [id number]  Social History    Socioeconomic History  . Marital status: Married    Spouse name: Not on file  . Number of children: Not on file  . Years of education: Not on file  . Highest education level: Not on file  Occupational History  . Not on file  Tobacco Use  . Smoking status: Never Smoker  . Smokeless tobacco: Never Used  Vaping Use  . Vaping Use: Never used  Substance and Sexual Activity  . Alcohol use: Yes    Comment: wine 4-5 X WEEKLY  . Drug use: No  . Sexual activity: Yes    Birth control/protection: None  Other Topics Concern  . Not on file  Social History Narrative  . Not on file   Social Determinants of Health   Financial Resource Strain: Not on file  Food Insecurity: Not on file  Transportation Needs: Not on file  Physical Activity: Not on file  Stress: Not on file  Social Connections: Not on file  Intimate Partner Violence: Not on file    Family History  Problem Relation Age of Onset  . Hypertension Mother   . Diabetes Father   . Breast cancer Cousin        second cousin on fathers side  . Colon cancer Neg Hx     Review of Systems  Constitutional: Negative.   HENT: Negative.   Eyes: Negative.   Respiratory: Negative.   Cardiovascular: Negative.   Gastrointestinal: Negative.  Genitourinary: Negative.   Musculoskeletal: Negative.   Skin: Negative.   Neurological: Negative.   Psychiatric/Behavioral: Negative.      Physical Exam BP 113/70   Ht 5' 3.5" (1.613 m)   Wt 178 lb (80.7 kg)   LMP 07/22/2020   BMI 31.04 kg/m    Physical Exam Constitutional:      General: She is not in acute distress.    Appearance: Normal appearance. She is well-developed.  Genitourinary:     Vulva and bladder normal.     Right Labia: No rash, tenderness, lesions, skin changes or Bartholin's cyst.    Left Labia: No tenderness, lesions, skin changes, Bartholin's cyst or rash.    No inguinal adenopathy present in the right or left side.    Pelvic Tanner Score: 5/5.    No vaginal  discharge, erythema, tenderness or bleeding.      Right Adnexa: not tender, not full and no mass present.    Left Adnexa: not tender, not full and no mass present.    No cervical motion tenderness, discharge, lesion or polyp.     Uterus is not enlarged or tender.     No uterine mass detected.    Pelvic exam was performed with patient in the lithotomy position.  Breasts:     Right: No inverted nipple, mass, nipple discharge, skin change or tenderness.     Left: No inverted nipple, mass, nipple discharge, skin change or tenderness.    HENT:     Head: Normocephalic and atraumatic.  Eyes:     General: No scleral icterus.    Conjunctiva/sclera: Conjunctivae normal.  Neck:     Thyroid: No thyromegaly.  Cardiovascular:     Rate and Rhythm: Normal rate and regular rhythm.     Heart sounds: No murmur heard. No friction rub. No gallop.   Pulmonary:     Effort: Pulmonary effort is normal. No respiratory distress.     Breath sounds: Normal breath sounds. No wheezing or rales.  Abdominal:     General: Bowel sounds are normal. There is no distension.     Palpations: Abdomen is soft. There is no mass.     Tenderness: There is no abdominal tenderness. There is no guarding or rebound.     Hernia: There is no hernia in the left inguinal area or right inguinal area.  Musculoskeletal:        General: No swelling or tenderness. Normal range of motion.     Cervical back: Normal range of motion and neck supple.  Lymphadenopathy:     Cervical: No cervical adenopathy.     Lower Body: No right inguinal adenopathy. No left inguinal adenopathy.  Neurological:     General: No focal deficit present.     Mental Status: She is alert and oriented to person, place, and time.     Cranial Nerves: No cranial nerve deficit.  Skin:    General: Skin is warm and dry.     Findings: No erythema or rash.  Psychiatric:        Mood and Affect: Mood normal.        Behavior: Behavior normal.        Judgment:  Judgment normal.     Female chaperone present for pelvic and breast  portions of the physical exam  Results: AUDIT Questionnaire (screen for alcoholism): 3 PHQ-9: 2   Assessment: 45 y.o. [id number] female here for routine annual gynecologic examination  Plan: Problem List Items Addressed This Visit  None   Visit Diagnoses    Women's annual routine gynecological examination    -  Primary   Relevant Orders   Cytology - PAP   Screening for depression       Screening for alcoholism       Pap smear for cervical cancer screening       Relevant Orders   Cytology - PAP      Screening: -- Blood pressure screen normal -- Weight screening: overweight: continue to monitor  -- Mammogram: due. Patient to call [facility] to schedule -- Depression screening negative (PHQ-9) -- Nutrition: normal -- cholesterol screening: not due for screening -- osteoporosis screening: not due -- tobacco screening: not using -- alcohol screening: AUDIT questionnaire indicates low-risk usage. -- family history of breast cancer screening: done. not at high risk. -- no evidence of domestic violence or intimate partner violence. -- STD screening: gonorrhea/chlamydia NAAT not collected per patient request. -- pap smear collected per ASCCP guidelines -- flu vaccine recieved -- COVID19 vaccine: received  Prentice Docker, MD 08/12/2020 11:23 AM

## 2020-08-17 LAB — CYTOLOGY - PAP
Comment: NEGATIVE
Diagnosis: NEGATIVE
High risk HPV: NEGATIVE

## 2020-09-11 ENCOUNTER — Other Ambulatory Visit: Payer: Self-pay | Admitting: Obstetrics and Gynecology

## 2020-09-11 DIAGNOSIS — Z1231 Encounter for screening mammogram for malignant neoplasm of breast: Secondary | ICD-10-CM

## 2020-09-23 ENCOUNTER — Other Ambulatory Visit: Payer: Self-pay

## 2020-09-23 ENCOUNTER — Ambulatory Visit
Admission: RE | Admit: 2020-09-23 | Discharge: 2020-09-23 | Disposition: A | Discharge status: Home / Self Care | Payer: BC Managed Care – PPO | Source: Ambulatory Visit | Attending: Obstetrics and Gynecology | Admitting: Obstetrics and Gynecology

## 2020-09-23 DIAGNOSIS — Z1231 Encounter for screening mammogram for malignant neoplasm of breast: Secondary | ICD-10-CM | POA: Insufficient documentation

## 2021-02-17 ENCOUNTER — Other Ambulatory Visit: Payer: Self-pay

## 2021-02-17 ENCOUNTER — Ambulatory Visit (INDEPENDENT_AMBULATORY_CARE_PROVIDER_SITE_OTHER): Payer: BC Managed Care – PPO

## 2021-02-17 ENCOUNTER — Ambulatory Visit
Admission: EM | Admit: 2021-02-17 | Discharge: 2021-02-17 | Disposition: A | Discharge status: Home / Self Care | Payer: BC Managed Care – PPO | Attending: Family Medicine | Admitting: Family Medicine

## 2021-02-17 DIAGNOSIS — M79672 Pain in left foot: Secondary | ICD-10-CM

## 2021-02-17 MED ORDER — MELOXICAM 15 MG PO TABS: 15.0000 mg | ORAL_TABLET | Freq: Every day | ORAL | 0 refills | Status: AC | PRN

## 2021-02-17 NOTE — ED Provider Notes (Signed)
[facility]    CSN: [id number] Arrival date & time: 02/17/21  1609      History   Chief Complaint Chief Complaint  Patient presents with   Foot Pain    left    HPI  45 year old female presents with a left foot pain.  Patient states that she noted pain Monday but worsened since that time.  She denies any fall, trauma, injury.  She has recently started exercising by walking approximately 2 miles a day.  She localizes the pain to the midfoot.  No reports of heel pain.  Patient states that she has pain particularly with bearing weight.  She is able to do so but with difficulty.  She has recently changed shoes.  No other changes.  No other associated symptoms.  Relieved with rest.  Past Medical History:  Diagnosis Date   Anemia    H/O WHILE ON HER PERIOD   Heart palpitations 2017   PT STATES THAT SHE SAW A CARDIOLOGIST AT [facility] IN [place] AND WORE A HOLTER MONITOR WHICH OVERALL SHOWED UP NORMAL-SHE WAS INSTRUCTED TO CUT BACK ON CAFFEINE AND PALPITATIONS HAVE DECREASED GREATLY   Murmur    ASYMPTOMATIC    Patient Active Problem List   Diagnosis Date Noted   Breast mass, right 08/18/2016    Past Surgical History:  Procedure Laterality Date   BREAST BIOPSY Right 08/04/2016   FIBROEPITHELIAL LESION WITH MODERATELY CELLULAR PROMINENT STROMAL    BREAST EXCISIONAL BIOPSY Right 2018   benign   BREAST LUMPECTOMY Right 09/06/2016   Procedure: BREAST LUMPECTOMY;  Surgeon: Robert Bellow, MD;  Location: [facility] ORS;  Service: General;  Laterality: Right;    OB History     Gravida  3   Para  2   Term  2   Preterm      AB  1   Living  2      SAB  1   IAB      Ectopic      Multiple      Live Births  2        Obstetric Comments  1st Menstrual Cycle:  14 1st Pregnancy: 32           Home Medications    Prior to Admission medications   Medication Sig Start Date End Date Taking? Authorizing Provider  meloxicam (MOBIC) 15 MG  tablet Take 1 tablet (15 mg total) by mouth daily as needed for pain. 02/17/21  Yes Coral Spikes, DO    Family History Family History  Problem Relation Age of Onset   Hypertension Mother    Diabetes Father    Breast cancer Cousin        second cousin on fathers side   Breast cancer Paternal Aunt        pat great aunt   Colon cancer Neg Hx     Social History Social History   Tobacco Use   Smoking status: Never   Smokeless tobacco: Never  Vaping Use   Vaping Use: Never used  Substance Use Topics   Alcohol use: Yes    Comment: wine 4-5 X WEEKLY   Drug use: No     Allergies   Patient has no known allergies.   Review of Systems Review of Systems  Constitutional: Negative.   Musculoskeletal:        Left foot pain.   Physical Exam Triage Vital Signs ED Triage Vitals  Enc Vitals Group  BP 02/17/21 1621 119/80     Pulse Rate 02/17/21 1621 78     Resp 02/17/21 1621 18     Temp 02/17/21 1621 98.2 F (36.8 C)     Temp Source 02/17/21 1621 Oral     SpO2 02/17/21 1621 98 %     Weight 02/17/21 1620 176 lb 9.6 oz (80.1 kg)     Height 02/17/21 1620 5' 3.75" (1.619 m)     Head Circumference --      Peak Flow --      Pain Score 02/17/21 1620 0     Pain Loc --      Pain Edu? --      Excl. in [place]? --    Updated Vital Signs BP 119/80 (BP Location: Left Arm)   Pulse 78   Temp 98.2 F (36.8 C) (Oral)   Resp 18   Ht 5' 3.75" (1.619 m)   Wt 80.1 kg   LMP 01/30/2021   SpO2 98%   BMI 30.55 kg/m   Visual Acuity Right Eye Distance:   Left Eye Distance:   Bilateral Distance:    Right Eye Near:   Left Eye Near:    Bilateral Near:     Physical Exam Vitals and nursing note reviewed.  Constitutional:      General: She is not in acute distress.    Appearance: Normal appearance. She is not ill-appearing.  HENT:     Head: Normocephalic and atraumatic.  Pulmonary:     Effort: Pulmonary effort is normal. No respiratory distress.  Musculoskeletal:     Comments:  Left foot -pes planus noted.  No tenderness over the plantar aspect of the heel.  No discrete tenderness to palpation over the foot, midfoot, or ankle.  Patient does endorse pain with weightbearing.  Neurological:     Mental Status: She is alert.  Psychiatric:        Mood and Affect: Mood normal.        Behavior: Behavior normal.     UC Treatments / Results  Labs (all labs ordered are listed, but only abnormal results are displayed) Labs Reviewed - No data to display  EKG   Radiology DG Foot Complete Left  Result Date: 02/17/2021 CLINICAL DATA:  Left foot pain. EXAM: LEFT FOOT - COMPLETE 3+ VIEW COMPARISON:  None. FINDINGS: The joint spaces are normal. No acute bony findings. Incidental small calcaneal heel spur. IMPRESSION: No acute bony findings. Electronically Signed   By: Marijo Sanes M.D.   On: 02/17/2021 17:23    Procedures Procedures (including critical care time)  Medications Ordered in UC Medications - No data to display  Initial Impression / Assessment and Plan / UC Course  I have reviewed the triage vital signs and the nursing notes.  Pertinent labs & imaging results that were available during my care of the patient were reviewed by me and considered in my medical decision making (see chart for details).    45 year old female presents with left foot pain.  X-ray was obtained and was independently reviewed by me.  Interpretation: No evidence of fracture.  Calcaneal spur noted.  I advised the patient to rest, ice the area and elevate.  Patient is to avoid walking for exercise at this time.  Meloxicam as directed.  If fails to improve, will need to see podiatry for further evaluation.  Final Clinical Impressions(s) / UC Diagnoses   Final diagnoses:  Left foot pain     Discharge Instructions  Rest, ice, elevation.  Hold off on the walking until back to normal.  Medication as prescribed.  If persists, see podiatry.   Take care  Dr. Lacinda Axon    ED  Prescriptions     Medication Sig Dispense Auth. Provider   meloxicam (MOBIC) 15 MG tablet Take 1 tablet (15 mg total) by mouth daily as needed for pain. 30 tablet Coral Spikes, DO      PDMP not reviewed this encounter.   Coral Spikes, [place] 02/17/21 1747

## 2021-02-17 NOTE — ED Triage Notes (Signed)
Pt here with C/O left foot pain, thinks she over worked it, no injury. Can not walk with out shoes.

## 2021-02-17 NOTE — Discharge Instructions (Signed)
Rest, ice, elevation.  Hold off on the walking until back to normal.  Medication as prescribed.  If persists, see podiatry.   Take care  Dr. Lacinda Axon

## 2021-03-17 ENCOUNTER — Other Ambulatory Visit: Payer: Self-pay | Admitting: Family Medicine

## 2022-02-28 ENCOUNTER — Other Ambulatory Visit: Payer: Self-pay | Admitting: Obstetrics and Gynecology

## 2022-02-28 DIAGNOSIS — Z1231 Encounter for screening mammogram for malignant neoplasm of breast: Secondary | ICD-10-CM

## 2022-03-17 ENCOUNTER — Ambulatory Visit
Admission: RE | Admit: 2022-03-17 | Discharge: 2022-03-17 | Disposition: A | Discharge status: Home / Self Care | Payer: BC Managed Care – PPO | Source: Ambulatory Visit | Attending: Obstetrics and Gynecology | Admitting: Obstetrics and Gynecology

## 2022-03-17 DIAGNOSIS — Z1231 Encounter for screening mammogram for malignant neoplasm of breast: Secondary | ICD-10-CM | POA: Diagnosis present

## 2023-01-25 ENCOUNTER — Other Ambulatory Visit: Payer: Self-pay | Admitting: Obstetrics and Gynecology

## 2023-01-25 DIAGNOSIS — Z1231 Encounter for screening mammogram for malignant neoplasm of breast: Secondary | ICD-10-CM

## 2023-03-16 LAB — EXTERNAL GENERIC LAB PROCEDURE: COLOGUARD: NEGATIVE

## 2023-03-16 LAB — COLOGUARD: COLOGUARD: NEGATIVE

## 2023-03-22 ENCOUNTER — Ambulatory Visit
Admission: RE | Admit: 2023-03-22 | Discharge: 2023-03-22 | Disposition: A | Discharge status: Home / Self Care | Payer: BC Managed Care – PPO | Source: Ambulatory Visit | Attending: Obstetrics and Gynecology | Admitting: Obstetrics and Gynecology

## 2023-03-22 DIAGNOSIS — Z1231 Encounter for screening mammogram for malignant neoplasm of breast: Secondary | ICD-10-CM | POA: Insufficient documentation

## 2024-02-08 ENCOUNTER — Other Ambulatory Visit: Payer: Self-pay | Admitting: Family Medicine

## 2024-02-08 DIAGNOSIS — Z1231 Encounter for screening mammogram for malignant neoplasm of breast: Secondary | ICD-10-CM
